# Patient Record
Sex: Female | Born: 1990 | Race: White | Hispanic: No | Marital: Single | Smoking: Current every day smoker
Health system: Southern US, Community
[De-identification: ages and names within clinical notes are randomized; demographics above are authoritative.]

## PROBLEM LIST (undated history)

## (undated) DIAGNOSIS — B009 Herpesviral infection, unspecified: Secondary | ICD-10-CM

## (undated) DIAGNOSIS — O4400 Placenta previa specified as without hemorrhage, unspecified trimester: Secondary | ICD-10-CM

## (undated) DIAGNOSIS — Z3490 Encounter for supervision of normal pregnancy, unspecified, unspecified trimester: Secondary | ICD-10-CM

## (undated) DIAGNOSIS — B029 Zoster without complications: Secondary | ICD-10-CM

## (undated) DIAGNOSIS — B379 Candidiasis, unspecified: Secondary | ICD-10-CM

## (undated) DIAGNOSIS — N200 Calculus of kidney: Secondary | ICD-10-CM

## (undated) HISTORY — DX: Complete placenta previa nos or without hemorrhage, unspecified trimester: O44.00

## (undated) HISTORY — PX: NO PAST SURGERIES: SHX2092

## (undated) HISTORY — DX: Zoster without complications: B02.9

---
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
## 2009-08-31 ENCOUNTER — Emergency Department (HOSPITAL_COMMUNITY): Admission: EM | Admit: 2009-08-31 | Discharge: 2009-08-31 | Payer: Self-pay | Admitting: Emergency Medicine

## 2009-09-01 ENCOUNTER — Emergency Department (HOSPITAL_COMMUNITY): Admission: EM | Admit: 2009-09-01 | Discharge: 2009-09-01 | Payer: Self-pay | Admitting: Emergency Medicine

## 2009-11-14 ENCOUNTER — Emergency Department (HOSPITAL_COMMUNITY): Admission: EM | Admit: 2009-11-14 | Discharge: 2009-11-14 | Payer: Self-pay | Admitting: Emergency Medicine

## 2011-03-18 LAB — URINE MICROSCOPIC-ADD ON

## 2011-03-18 LAB — URINALYSIS, ROUTINE W REFLEX MICROSCOPIC
Bilirubin Urine: NEGATIVE
Bilirubin Urine: NEGATIVE
Glucose, UA: NEGATIVE mg/dL
Glucose, UA: NEGATIVE mg/dL
Ketones, ur: NEGATIVE mg/dL
Ketones, ur: NEGATIVE mg/dL
Leukocytes, UA: NEGATIVE
Leukocytes, UA: NEGATIVE
Nitrite: NEGATIVE
Nitrite: NEGATIVE
Protein, ur: NEGATIVE mg/dL
Protein, ur: NEGATIVE mg/dL
Specific Gravity, Urine: 1.015 (ref 1.005–1.030)
Specific Gravity, Urine: 1.03 (ref 1.005–1.030)
Urobilinogen, UA: 0.2 mg/dL (ref 0.0–1.0)
Urobilinogen, UA: 0.2 mg/dL (ref 0.0–1.0)
pH: 5.5 (ref 5.0–8.0)
pH: 6 (ref 5.0–8.0)

## 2011-03-18 LAB — PREGNANCY, URINE
Preg Test, Ur: NEGATIVE
Preg Test, Ur: NEGATIVE

## 2011-03-18 LAB — URINE CULTURE: Colony Count: 5000

## 2011-05-09 ENCOUNTER — Emergency Department (HOSPITAL_COMMUNITY)
Admission: EM | Admit: 2011-05-09 | Discharge: 2011-05-09 | Disposition: A | Discharge status: Home / Self Care | Payer: 59 | Attending: Emergency Medicine | Admitting: Emergency Medicine

## 2011-05-09 DIAGNOSIS — L559 Sunburn, unspecified: Secondary | ICD-10-CM | POA: Insufficient documentation

## 2011-05-09 DIAGNOSIS — W899XXA Exposure to unspecified man-made visible and ultraviolet light, initial encounter: Secondary | ICD-10-CM | POA: Insufficient documentation

## 2011-05-09 DIAGNOSIS — Z87442 Personal history of urinary calculi: Secondary | ICD-10-CM | POA: Insufficient documentation

## 2011-05-09 DIAGNOSIS — L259 Unspecified contact dermatitis, unspecified cause: Secondary | ICD-10-CM | POA: Insufficient documentation

## 2011-09-07 ENCOUNTER — Encounter: Payer: Self-pay | Admitting: Emergency Medicine

## 2011-09-07 ENCOUNTER — Emergency Department (HOSPITAL_COMMUNITY): Payer: 59

## 2011-09-07 ENCOUNTER — Emergency Department (HOSPITAL_COMMUNITY)
Admission: EM | Admit: 2011-09-07 | Discharge: 2011-09-07 | Disposition: A | Discharge status: Home / Self Care | Payer: 59 | Attending: Emergency Medicine | Admitting: Emergency Medicine

## 2011-09-07 DIAGNOSIS — IMO0002 Reserved for concepts with insufficient information to code with codable children: Secondary | ICD-10-CM | POA: Insufficient documentation

## 2011-09-07 DIAGNOSIS — F17200 Nicotine dependence, unspecified, uncomplicated: Secondary | ICD-10-CM | POA: Insufficient documentation

## 2011-09-07 DIAGNOSIS — S6390XA Sprain of unspecified part of unspecified wrist and hand, initial encounter: Secondary | ICD-10-CM | POA: Insufficient documentation

## 2011-09-07 MED ORDER — IBUPROFEN 800 MG PO TABS
800.0000 mg | ORAL_TABLET | Freq: Once | ORAL | Status: AC
  Administered 2011-09-07: 800 mg via ORAL
  Filled 2011-09-07: qty 1

## 2011-09-07 MED ORDER — IBUPROFEN 800 MG PO TABS: 800.0000 mg | ORAL_TABLET | Freq: Three times a day (TID) | ORAL | Status: AC

## 2011-09-07 NOTE — ED Notes (Signed)
Pt c/o right lateral hand pain after hitting it against metal bar earlier today.

## 2011-09-07 NOTE — ED Provider Notes (Signed)
History     CSN: [id number] Arrival date & time: 09/07/2011  6:04 PM  Chief Complaint  Patient presents with  . Hand Pain    (Consider location/radiation/quality/duration/timing/severity/associated sxs/prior treatment) Patient is a 20 y.o. female presenting with hand pain. The history is provided by the patient.  Hand Pain This is a new problem. The current episode started today. The problem occurs constantly. Associated symptoms include arthralgias. Pertinent negatives include no abdominal pain, chest pain, congestion, fever, headaches, joint swelling, nausea, neck pain, numbness, rash, sore throat or weakness. Associated symptoms comments: She was pushing against a metal object on a piece of farm equipment while her boyfriend was putting bolts in it when her hands slipped,  Causing pain and hyperextension of her hands.  Her right hand hurts worse than the left.. Exacerbated by: flexing fingers and palpation makes worse. She has tried nothing for the symptoms.    History reviewed. No pertinent past medical history.  History reviewed. No pertinent past surgical history.  History reviewed. No pertinent family history.  History  Substance Use Topics  . Smoking status: Current Everyday Smoker -- 0.5 packs/day  . Smokeless tobacco: Not on file  . Alcohol Use: No    OB History    Grav Para Term Preterm Abortions TAB SAB Ect Mult Living                  Review of Systems  Constitutional: Negative for fever.  HENT: Negative for congestion, sore throat and neck pain.   Eyes: Negative.   Respiratory: Negative for chest tightness and shortness of breath.   Cardiovascular: Negative for chest pain.  Gastrointestinal: Negative for nausea and abdominal pain.  Genitourinary: Negative.   Musculoskeletal: Positive for arthralgias. Negative for joint swelling.  Skin: Positive for color change. Negative for rash and wound.  Neurological: Negative for dizziness, weakness, light-headedness,  numbness and headaches.  Hematological: Negative.   Psychiatric/Behavioral: Negative.     Allergies  Review of patient's allergies indicates no known allergies.  Home Medications   Current Outpatient Rx  Name Route Sig Dispense Refill  . IBUPROFEN 200 MG PO TABS Oral Take 400 mg by mouth as needed. For pain     . TETRAHYDROZOLINE HCL 0.05 % OP SOLN Both Eyes Place 1 drop into both eyes daily as needed. For red eye and relief       BP 115/63  Pulse 91  Temp(Src) 99.2 F (37.3 C) (Oral)  Resp 18  Ht 5\' 6"  (1.676 m)  Wt 118 lb 11.2 oz (53.842 kg)  BMI 19.16 kg/m2  SpO2 100%  LMP 08/19/2011  Physical Exam  Nursing note and vitals reviewed. Constitutional: She is oriented to person, place, and time. She appears well-developed and well-nourished.  HENT:  Head: Normocephalic and atraumatic.  Eyes: Conjunctivae are normal.  Neck: Normal range of motion.  Cardiovascular: Normal rate, regular rhythm, normal heart sounds and intact distal pulses.  Exam reveals no decreased pulses.   Pulses:      Dorsalis pedis pulses are 2+ on the right side, and 2+ on the left side.       Posterior tibial pulses are 2+ on the right side, and 2+ on the left side.  Pulmonary/Chest: Effort normal and breath sounds normal. She has no wheezes.  Abdominal: Soft. Bowel sounds are normal. There is no tenderness.  Musculoskeletal: Normal range of motion. She exhibits edema and tenderness.       Right hand: She exhibits tenderness and swelling. She  exhibits normal range of motion, normal two-point discrimination, normal capillary refill and no deformity. normal sensation noted. Normal strength noted.       Left hand: She exhibits tenderness. She exhibits normal range of motion, normal capillary refill and no deformity. normal sensation noted. Normal strength noted.       Hands: Neurological: She is alert and oriented to person, place, and time. No sensory deficit.  Skin: Skin is warm, dry and intact.    Psychiatric: She has a normal mood and affect.    ED Course  Procedures (including critical care time)  Labs Reviewed - No data to display Dg Hand Complete Left  09/07/2011  *RADIOLOGY REPORT*  Clinical Data: Trauma.  Pain fifth metacarpals.  LEFT HAND - COMPLETE 3+ VIEW  Comparison: Right hand radiographs same day.  Findings: Some of the digits overlap in the lateral projection, limiting detail of the individual fingers.  The joints appear aligned.  No subluxation or dislocation is identified.  No acute or healing fracture or focal bony abnormality.  No discrete soft tissue swelling or radiopaque foreign body.  IMPRESSION: Negative.  Original Report Authenticated By: Britta Mccreedy, M.D.   Dg Hand Complete Right  09/07/2011  *RADIOLOGY REPORT*  Clinical Data: Bilateral hand pain fifth metacarpals.  RIGHT HAND - COMPLETE 3+ VIEW  Comparison: Left hand radiographs 09/07/2011 and right wrist radiographs 08/12/2008  Findings:  Some of the fingers overlap on the lateral projection, limiting detail of the individual digits.  No evidence of subluxation or dislocation.  No acute or healing fracture is identified. No focal bony abnormality.  No discrete soft tissue swelling or radiopaque foreign body.  IMPRESSION: Negative.  Original Report Authenticated By: Britta Mccreedy, M.D.     No diagnosis found.    MDM  Hand sprain and contusion.          Candis Musa, PA 09/07/11 1951

## 2011-09-09 NOTE — ED Provider Notes (Signed)
Medical screening examination/treatment/procedure(s) were performed by non-physician practitioner and as supervising physician I was immediately available for consultation/collaboration.   Aldine Grainger, MD 09/09/11 0514 

## 2011-09-16 LAB — URINE MICROSCOPIC-ADD ON

## 2011-09-16 LAB — CBC
HCT: 40.6 % (ref 36.0–49.0)
Hemoglobin: 13.8 g/dL (ref 12.0–16.0)
MCHC: 34 g/dL (ref 31.0–37.0)
MCV: 91.2 fL (ref 78.0–98.0)
Platelets: 221 10*3/uL (ref 150–400)
RBC: 4.46 MIL/uL (ref 3.80–5.70)
RDW: 13.3 % (ref 11.4–15.5)
WBC: 6.8 10*3/uL (ref 4.5–13.5)

## 2011-09-16 LAB — URINALYSIS, ROUTINE W REFLEX MICROSCOPIC
Bilirubin Urine: NEGATIVE
Glucose, UA: NEGATIVE mg/dL
Ketones, ur: NEGATIVE mg/dL
Leukocytes, UA: NEGATIVE
Nitrite: NEGATIVE
Protein, ur: NEGATIVE mg/dL
Specific Gravity, Urine: 1.025 (ref 1.005–1.030)
Urobilinogen, UA: 0.2 mg/dL (ref 0.0–1.0)
pH: 6.5 (ref 5.0–8.0)

## 2011-09-16 LAB — DIFFERENTIAL
Basophils Absolute: 0 10*3/uL (ref 0.0–0.1)
Basophils Relative: 1 % (ref 0–1)
Eosinophils Absolute: 0.1 10*3/uL (ref 0.0–1.2)
Eosinophils Relative: 1 % (ref 0–5)
Lymphocytes Relative: 40 % (ref 24–48)
Lymphs Abs: 2.7 10*3/uL (ref 1.1–4.8)
Monocytes Absolute: 0.7 10*3/uL (ref 0.2–1.2)
Monocytes Relative: 11 % (ref 3–11)
Neutro Abs: 3.2 10*3/uL (ref 1.7–8.0)
Neutrophils Relative %: 47 % (ref 43–71)

## 2011-09-16 LAB — WET PREP, GENITAL
Trich, Wet Prep: NONE SEEN
Yeast Wet Prep HPF POC: NONE SEEN

## 2011-09-16 LAB — COMPREHENSIVE METABOLIC PANEL
Alkaline Phosphatase: 66 U/L (ref 47–119)
BUN: 12 mg/dL (ref 6–23)
CO2: 24 mEq/L (ref 19–32)
Glucose, Bld: 113 mg/dL — ABNORMAL HIGH (ref 70–99)
Potassium: 3.5 mEq/L (ref 3.5–5.1)
Total Bilirubin: 1 mg/dL (ref 0.3–1.2)
Total Protein: 7.1 g/dL (ref 6.0–8.3)

## 2011-09-16 LAB — PREGNANCY, URINE: Preg Test, Ur: NEGATIVE

## 2012-07-20 ENCOUNTER — Emergency Department (HOSPITAL_COMMUNITY)
Admission: EM | Admit: 2012-07-20 | Discharge: 2012-07-20 | Disposition: A | Discharge status: Home / Self Care | Payer: 59 | Attending: Emergency Medicine | Admitting: Emergency Medicine

## 2012-07-20 ENCOUNTER — Encounter (HOSPITAL_COMMUNITY): Payer: Self-pay | Admitting: *Deleted

## 2012-07-20 ENCOUNTER — Emergency Department (HOSPITAL_COMMUNITY): Payer: 59

## 2012-07-20 DIAGNOSIS — F17200 Nicotine dependence, unspecified, uncomplicated: Secondary | ICD-10-CM | POA: Insufficient documentation

## 2012-07-20 DIAGNOSIS — R109 Unspecified abdominal pain: Secondary | ICD-10-CM | POA: Insufficient documentation

## 2012-07-20 HISTORY — DX: Calculus of kidney: N20.0

## 2012-07-20 LAB — URINALYSIS, ROUTINE W REFLEX MICROSCOPIC
Bilirubin Urine: NEGATIVE
Glucose, UA: NEGATIVE mg/dL
Hgb urine dipstick: NEGATIVE
Ketones, ur: NEGATIVE mg/dL
Protein, ur: NEGATIVE mg/dL
pH: 7.5 (ref 5.0–8.0)

## 2012-07-20 MED ORDER — KETOROLAC TROMETHAMINE 60 MG/2ML IM SOLN
30.0000 mg | Freq: Once | INTRAMUSCULAR | Status: AC
  Administered 2012-07-20: 30 mg via INTRAMUSCULAR
  Filled 2012-07-20: qty 2

## 2012-07-20 MED ORDER — ONDANSETRON HCL 4 MG/2ML IJ SOLN
4.0000 mg | Freq: Once | INTRAMUSCULAR | Status: AC
  Administered 2012-07-20: 4 mg via INTRAVENOUS
  Filled 2012-07-20: qty 2

## 2012-07-20 MED ORDER — PROMETHAZINE HCL 25 MG RE SUPP: 25.0000 mg | Freq: Four times a day (QID) | RECTAL | Status: DC | PRN

## 2012-07-20 MED ORDER — MORPHINE SULFATE 4 MG/ML IJ SOLN
4.0000 mg | Freq: Once | INTRAMUSCULAR | Status: AC
  Administered 2012-07-20: 4 mg via INTRAVENOUS
  Filled 2012-07-20: qty 1

## 2012-07-20 MED ORDER — TAMSULOSIN HCL 0.4 MG PO CAPS: ORAL_CAPSULE | ORAL | Status: DC

## 2012-07-20 MED ORDER — HYDROCODONE-ACETAMINOPHEN 5-325 MG PO TABS: ORAL_TABLET | ORAL | Status: DC

## 2012-07-20 MED ORDER — SODIUM CHLORIDE 0.9 % IV SOLN
INTRAVENOUS | Status: DC
  Administered 2012-07-20: 16:00:00 via INTRAVENOUS

## 2012-07-20 NOTE — ED Notes (Signed)
11 am onset of lt flank pain, nausea , no vomiting, no dysuria.

## 2012-07-20 NOTE — ED Notes (Signed)
Discharge instructions reviewed with pt, questions answered. Pt verbalized understanding.  

## 2012-07-20 NOTE — ED Notes (Signed)
Patient is wondering how long before discharge.

## 2012-07-20 NOTE — ED Notes (Signed)
Pt c/o left flank pain since this am. Pt alert and oriented x 3. Skin warm and dry. Color pink. Pt states that she has a history of kidney stones. Family at bedside.

## 2012-07-20 NOTE — ED Provider Notes (Signed)
History   This chart was scribed for Ward Givens, MD by Melba Coon. The patient was seen in room APA08/APA08 and the patient's care was started at 4:09PM.    CSN: [id number]  Arrival date & time 07/20/12  1442   First MD Initiated Contact with Patient 07/20/12 1559      Chief Complaint  Patient presents with  . Flank Pain    (Consider location/radiation/quality/duration/timing/severity/associated sxs/prior treatment) The history is provided by the patient. No language interpreter was used.   Christine Day is a 21 y.o. female who presents to the Emergency Department complaining of constant, moderate to severe, achey left flank pain with an onset this morning at 11AM. Pt is nauseous and feels she is having difficulty urinating, but no hematuria. LNMP: in the last week. Laying down flat on her back aggravates the pain; bending forward at the wasit or pressing on her flank alleviates the pain. Pt has a Hx of kidney stones; last kidney stone was 2 years ago; pt had similar symptoms with last kidney stone. Pt passes kidney stones, on average, less than a day. Pt drinks 3 cans of Mountain Dew a day and sweet tea everyday. Family Hx of kidney stones - grandmother and 3 aunts on father's side. No HA, fever, neck pain, sore throat, rash, back pain, CP, SOB, abd pain, vomiting, diarrhea, dysuria, or extremity pain, edema, weakness, numbness, or tingling. Pt is sexually active and uses condoms. No known allergies. No other pertinent medical symptoms.  PCP: Dr. Lilyan Punt   Past Medical History  Diagnosis Date  . Kidney stone     History reviewed. No pertinent past surgical history.  History reviewed. No pertinent family history. MGM, 3 Maunts all have renal stones  History  Substance Use Topics  . Smoking status: Current Everyday Smoker -- 0.5 packs/day  . Smokeless tobacco: Not on file  . Alcohol Use: No  drinks a lot of caffeine, minimal milk products   OB History    Grav  Para Term Preterm Abortions TAB SAB Ect Mult Living                  Review of Systems 10 Systems reviewed and all are negative for acute change except as noted in the HPI.   Allergies  Review of patient's allergies indicates no known allergies.  Home Medications   Current Outpatient Rx  Name Route Sig Dispense Refill  . IBUPROFEN 200 MG PO TABS Oral Take 400 mg by mouth as needed. For pain     . TETRAHYDROZOLINE HCL 0.05 % OP SOLN Both Eyes Place 1 drop into both eyes daily as needed. For red eye and relief       BP 117/71  Pulse 88  Temp 98.4 F (36.9 C) (Oral)  Resp 20  Ht 5\' 6"  (1.676 m)  Wt 118 lb (53.524 kg)  BMI 19.05 kg/m2  SpO2 100%  LMP 07/10/2012  Vital signs normal    Physical Exam  Nursing note and vitals reviewed. Constitutional: She is oriented to person, place, and time. She appears well-developed and well-nourished.  Non-toxic appearance. She does not appear ill. No distress.  HENT:  Head: Normocephalic and atraumatic.  Right Ear: External ear normal.  Left Ear: External ear normal.  Nose: Nose normal. No mucosal edema or rhinorrhea.  Mouth/Throat: Oropharynx is clear and moist and mucous membranes are normal. No dental abscesses or uvula swelling.  Eyes: Conjunctivae and EOM are normal. Pupils are equal, round,  and reactive to light.  Neck: Normal range of motion and full passive range of motion without pain. Neck supple. No tracheal deviation present.  Cardiovascular: Normal rate, regular rhythm and normal heart sounds.  Exam reveals no gallop and no friction rub.   No murmur heard. Pulmonary/Chest: Effort normal and breath sounds normal. No respiratory distress. She has no wheezes. She has no rhonchi. She has no rales. She exhibits no tenderness and no crepitus.  Abdominal: Soft. Normal appearance and bowel sounds are normal. She exhibits no distension. There is tenderness (mild LLQ tenderness). There is no rebound and no guarding.  Genitourinary:         No CVAT bilaterally  Musculoskeletal: Normal range of motion. She exhibits no edema and no tenderness.       Moves all extremities well.   Neurological: She is alert and oriented to person, place, and time. She has normal strength. No cranial nerve deficit.  Skin: Skin is warm, dry and intact. No rash noted. No erythema. No pallor.  Psychiatric: She has a normal mood and affect. Her speech is normal and behavior is normal. Her mood appears not anxious.    ED Course  Procedures (including critical care time)  Medications  0.9 %  sodium chloride infusion (  Intravenous New Bag/Given 07/20/12 1625)  ketorolac (TORADOL) injection 30 mg (not administered)  morphine 4 MG/ML injection 4 mg (not administered)  morphine 4 MG/ML injection 4 mg (4 mg Intravenous Given 07/20/12 1627)  ondansetron (ZOFRAN) injection 4 mg (4 mg Intravenous Given 07/20/12 1627)   Review of old records shows patient's last CT of the abdomen pelvis was in 2009 showing a nonobstructing 2 mm left lower renal pole calculus. At that time she had a 1-2 mm right UVJ stone.  DIAGNOSTIC STUDIES: Oxygen Saturation is 100% on room air, normal by my interpretation.    COORDINATION OF CARE:  4:16PM - IV fluids, morphine, Zofran, renal ultrasound, and UA will be ordered for the pt. 7:17PM - recheck; lab and imaging results reviewed; results are negative, no blockage noted. Pt states that she feels much better and is ready for d/c. Marland Kitchen Results for orders placed during the hospital encounter of 07/20/12  URINALYSIS, ROUTINE W REFLEX MICROSCOPIC      Component Value Range   Color, Urine YELLOW  YELLOW   APPearance HAZY (*) CLEAR   Specific Gravity, Urine 1.020  1.005 - 1.030   pH 7.5  5.0 - 8.0   Glucose, UA NEGATIVE  NEGATIVE mg/dL   Hgb urine dipstick NEGATIVE  NEGATIVE   Bilirubin Urine NEGATIVE  NEGATIVE   Ketones, ur NEGATIVE  NEGATIVE mg/dL   Protein, ur NEGATIVE  NEGATIVE mg/dL   Urobilinogen, UA 1.0  0.0 - 1.0 mg/dL    Nitrite NEGATIVE  NEGATIVE   Leukocytes, UA NEGATIVE  NEGATIVE  PREGNANCY, URINE      Component Value Range   Preg Test, Ur NEGATIVE  NEGATIVE   Laboratory interpretation all normal  US Renal  07/20/2012  *RADIOLOGY REPORT*  Clinical Data: Left flank pain, history of stone  RENAL/URINARY TRACT ULTRASOUND COMPLETE  Comparison:  CT abdomen pelvis dated 11/22/2008  Findings:  Right Kidney:  Measures 11.9 cm.  No mass or hydronephrosis.  Left Kidney:  Measures 11.1 cm.  No mass or hydronephrosis.  Bladder:  Within normal limits.  Bilateral bladder jets are present.  IMPRESSION: Negative renal ultrasound.  Original Report Authenticated By: Charline Bills, M.D.     1. Left flank  pain    New Prescriptions   HYDROCODONE-ACETAMINOPHEN (NORCO/VICODIN) 5-325 MG PER TABLET    Take 1 or 2 po Q 6hrs for pain   PROMETHAZINE (PHENERGAN) 25 MG SUPPOSITORY    Place 1 suppository (25 mg total) rectally every 6 (six) hours as needed for nausea.   TAMSULOSIN HCL (FLOMAX) 0.4 MG CAPS    Take 1 po QD until you pass the stone.    Plan discharge  Devoria Albe, MD, FACEP    MDM   I personally performed the services described in this documentation, which was scribed in my presence. The recorded information has been reviewed and considered.        Ward Givens, MD 07/20/12 2026

## 2013-05-23 ENCOUNTER — Other Ambulatory Visit (INDEPENDENT_AMBULATORY_CARE_PROVIDER_SITE_OTHER): Payer: BC Managed Care – PPO | Admitting: Obstetrics & Gynecology

## 2013-05-23 VITALS — BP 120/80 | Ht 65.0 in | Wt 111.0 lb

## 2013-05-23 DIAGNOSIS — Z3201 Encounter for pregnancy test, result positive: Secondary | ICD-10-CM

## 2013-05-23 DIAGNOSIS — Z3200 Encounter for pregnancy test, result unknown: Secondary | ICD-10-CM

## 2013-05-30 ENCOUNTER — Other Ambulatory Visit: Payer: Self-pay | Admitting: Obstetrics & Gynecology

## 2013-05-30 DIAGNOSIS — O3680X0 Pregnancy with inconclusive fetal viability, not applicable or unspecified: Secondary | ICD-10-CM

## 2013-06-06 ENCOUNTER — Ambulatory Visit (INDEPENDENT_AMBULATORY_CARE_PROVIDER_SITE_OTHER): Payer: BC Managed Care – PPO

## 2013-06-06 ENCOUNTER — Other Ambulatory Visit: Payer: Self-pay | Admitting: Obstetrics & Gynecology

## 2013-06-06 DIAGNOSIS — O26849 Uterine size-date discrepancy, unspecified trimester: Secondary | ICD-10-CM

## 2013-06-06 DIAGNOSIS — O3680X0 Pregnancy with inconclusive fetal viability, not applicable or unspecified: Secondary | ICD-10-CM

## 2013-06-06 NOTE — Progress Notes (Signed)
U/S-single IUP with +FCA noted, CRL c/w 9+2wks, EDD 01/07/2014, cx long and closed, retroflexed uterus, bilateral adnexa wnl

## 2013-06-12 ENCOUNTER — Other Ambulatory Visit: Payer: Self-pay | Admitting: Obstetrics & Gynecology

## 2013-06-12 DIAGNOSIS — Z36 Encounter for antenatal screening of mother: Secondary | ICD-10-CM

## 2013-06-20 ENCOUNTER — Other Ambulatory Visit: Payer: BC Managed Care – PPO

## 2013-06-20 ENCOUNTER — Encounter: Payer: BC Managed Care – PPO | Admitting: Adult Health

## 2013-06-21 ENCOUNTER — Other Ambulatory Visit: Payer: BC Managed Care – PPO

## 2013-06-21 ENCOUNTER — Other Ambulatory Visit: Payer: Self-pay | Admitting: Obstetrics & Gynecology

## 2013-06-21 ENCOUNTER — Ambulatory Visit (INDEPENDENT_AMBULATORY_CARE_PROVIDER_SITE_OTHER): Payer: BC Managed Care – PPO

## 2013-06-21 DIAGNOSIS — Z3401 Encounter for supervision of normal first pregnancy, first trimester: Secondary | ICD-10-CM

## 2013-06-21 DIAGNOSIS — Z36 Encounter for antenatal screening of mother: Secondary | ICD-10-CM

## 2013-06-21 LAB — CBC
MCH: 30.6 pg (ref 26.0–34.0)
MCHC: 34.4 g/dL (ref 30.0–36.0)
MCV: 88.9 fL (ref 78.0–100.0)
Platelets: 205 10*3/uL (ref 150–400)

## 2013-06-21 LAB — HEPATITIS B SURFACE ANTIGEN: Hepatitis B Surface Ag: NEGATIVE

## 2013-06-21 LAB — TSH: TSH: 0.268 u[IU]/mL — ABNORMAL LOW (ref 0.350–4.500)

## 2013-06-21 LAB — HIV ANTIBODY (ROUTINE TESTING W REFLEX): HIV: NONREACTIVE

## 2013-06-21 NOTE — Progress Notes (Signed)
U/S(11+3wks)-single IUP with +FCA noted, CRL c/w dates, cx long and closed, bilateral adnexa WNL, NB present, Nt=1.34mm

## 2013-06-22 LAB — ANTIBODY SCREEN: Antibody Screen: NEGATIVE

## 2013-06-22 LAB — RUBELLA SCREEN: Rubella: 3.66 Index — ABNORMAL HIGH (ref <0.90)

## 2013-06-22 LAB — VARICELLA ZOSTER ANTIBODY, IGG: Varicella IgG: 729.1 Index — ABNORMAL HIGH (ref <135.00)

## 2013-06-22 LAB — ABO AND RH: Rh Type: POSITIVE

## 2013-06-24 ENCOUNTER — Encounter (HOSPITAL_COMMUNITY): Payer: Self-pay | Admitting: *Deleted

## 2013-06-24 ENCOUNTER — Telehealth: Payer: Self-pay | Admitting: Obstetrics & Gynecology

## 2013-06-24 ENCOUNTER — Emergency Department (HOSPITAL_COMMUNITY)
Admission: EM | Admit: 2013-06-24 | Discharge: 2013-06-25 | Disposition: A | Discharge status: Home / Self Care | Payer: BC Managed Care – PPO | Attending: Emergency Medicine | Admitting: Emergency Medicine

## 2013-06-24 DIAGNOSIS — O9989 Other specified diseases and conditions complicating pregnancy, childbirth and the puerperium: Secondary | ICD-10-CM | POA: Insufficient documentation

## 2013-06-24 DIAGNOSIS — R079 Chest pain, unspecified: Secondary | ICD-10-CM | POA: Insufficient documentation

## 2013-06-24 DIAGNOSIS — Z87891 Personal history of nicotine dependence: Secondary | ICD-10-CM | POA: Insufficient documentation

## 2013-06-24 DIAGNOSIS — F17200 Nicotine dependence, unspecified, uncomplicated: Secondary | ICD-10-CM | POA: Insufficient documentation

## 2013-06-24 DIAGNOSIS — Z87442 Personal history of urinary calculi: Secondary | ICD-10-CM | POA: Insufficient documentation

## 2013-06-24 DIAGNOSIS — R109 Unspecified abdominal pain: Secondary | ICD-10-CM

## 2013-06-24 HISTORY — DX: Encounter for supervision of normal pregnancy, unspecified, unspecified trimester: Z34.90

## 2013-06-24 LAB — URINALYSIS, ROUTINE W REFLEX MICROSCOPIC
Bilirubin Urine: NEGATIVE
Glucose, UA: 100 mg/dL — AB
Hgb urine dipstick: NEGATIVE
Ketones, ur: NEGATIVE mg/dL
Protein, ur: NEGATIVE mg/dL
pH: 6 (ref 5.0–8.0)

## 2013-06-24 LAB — CBC WITH DIFFERENTIAL/PLATELET
Basophils Absolute: 0 10*3/uL (ref 0.0–0.1)
Basophils Relative: 0 % (ref 0–1)
Eosinophils Relative: 0 % (ref 0–5)
HCT: 33.6 % — ABNORMAL LOW (ref 36.0–46.0)
Hemoglobin: 11.9 g/dL — ABNORMAL LOW (ref 12.0–15.0)
Lymphocytes Relative: 22 % (ref 12–46)
MCHC: 35.4 g/dL (ref 30.0–36.0)
MCV: 92.1 fL (ref 78.0–100.0)
Monocytes Absolute: 0.7 10*3/uL (ref 0.1–1.0)
Monocytes Relative: 7 % (ref 3–12)
Neutro Abs: 7.9 10*3/uL — ABNORMAL HIGH (ref 1.7–7.7)
RDW: 12.5 % (ref 11.5–15.5)

## 2013-06-24 LAB — BASIC METABOLIC PANEL
BUN: 11 mg/dL (ref 6–23)
CO2: 26 mEq/L (ref 19–32)
Calcium: 9.2 mg/dL (ref 8.4–10.5)
Chloride: 102 mEq/L (ref 96–112)
Creatinine, Ser: 0.51 mg/dL (ref 0.50–1.10)

## 2013-06-24 MED ORDER — HYDROMORPHONE HCL PF 1 MG/ML IJ SOLN
1.0000 mg | Freq: Once | INTRAMUSCULAR | Status: AC
  Administered 2013-06-24: 1 mg via INTRAVENOUS
  Filled 2013-06-24: qty 1

## 2013-06-24 MED ORDER — ONDANSETRON HCL 4 MG/2ML IJ SOLN
4.0000 mg | Freq: Once | INTRAMUSCULAR | Status: AC
  Administered 2013-06-24: 4 mg via INTRAVENOUS
  Filled 2013-06-24: qty 2

## 2013-06-24 MED ORDER — MORPHINE SULFATE 4 MG/ML IJ SOLN
6.0000 mg | Freq: Once | INTRAMUSCULAR | Status: AC
  Administered 2013-06-24: 4 mg via INTRAVENOUS
  Filled 2013-06-24: qty 1

## 2013-06-24 MED ORDER — SODIUM CHLORIDE 0.9 % IV SOLN
1000.0000 mL | Freq: Once | INTRAVENOUS | Status: AC
  Administered 2013-06-24: 1000 mL via INTRAVENOUS

## 2013-06-24 MED ORDER — MORPHINE SULFATE 4 MG/ML IJ SOLN
6.0000 mg | Freq: Once | INTRAMUSCULAR | Status: AC
  Administered 2013-06-24: 6 mg via INTRAVENOUS
  Filled 2013-06-24: qty 2

## 2013-06-24 MED ORDER — MORPHINE SULFATE 2 MG/ML IJ SOLN
INTRAMUSCULAR | Status: AC
  Administered 2013-06-24: 2 mg
  Filled 2013-06-24: qty 1

## 2013-06-24 NOTE — ED Notes (Signed)
Pt co sharp flank pain and states unable to urinate this morning, pt states she is [redacted] weeks pregnant. Pt has Hx of kidney stones and pain has increased to 8/10.

## 2013-06-24 NOTE — ED Provider Notes (Signed)
History    This chart was scribed for Benny Lennert, MD, by Frederik Pear, ED scribe. The patient was seen in room APA02/APA02 and the patient's care was started at 2059.   CSN: [id number] Arrival date & time 06/24/13  4098  First MD Initiated Contact with Patient 06/24/13 2059     Chief Complaint  Patient presents with  . Abdominal Pain   (Consider location/radiation/quality/duration/timing/severity/associated sxs/prior Treatment) Patient is a 22 y.o. female presenting with flank pain. The history is provided by the patient and medical records. No language interpreter was used.  Flank Pain This is a recurrent problem. The current episode started 12 to 24 hours ago. The problem occurs constantly. The problem has been gradually worsening. Associated symptoms include chest pain and abdominal pain. Pertinent negatives include no shortness of breath.    HPI Comments: Christine Day is a 22 y.o. female with a h/o of kidney stones x5 in the last 3 years who presents to the Emergency Department complaining of worsening, sharp left flank pain that radiated to her abdomen, back, and chest that began this morning and worsened until 1500. She reports the all of the pain aside from the flank pain has resolved, but the pain is still constant and severe. She reports a h/o of similar pain consistent with previous kidney stones. She also reports she has been unable to void since this morning. Denies nausea. She states she is currently [redacted] weeks pregnant and has ad 2 US of the fetus with the most recent being on 07/10.   Past Medical History  Diagnosis Date  . Kidney stone   . Pregnant    History reviewed. No pertinent past surgical history. History reviewed. No pertinent family history. History  Substance Use Topics  . Smoking status: Current Every Day Smoker -- 0.50 packs/day    Types: Cigarettes  . Smokeless tobacco: Not on file  . Alcohol Use: No   OB History   Grav Para Term Preterm  Abortions TAB SAB Ect Mult Living   1              Review of Systems  Constitutional: Negative for appetite change and fatigue.  HENT: Negative for congestion, sinus pressure and ear discharge.   Eyes: Negative for discharge.  Respiratory: Negative for cough and shortness of breath.   Cardiovascular: Positive for chest pain.  Gastrointestinal: Positive for abdominal pain. Negative for nausea, vomiting and diarrhea.  Genitourinary: Positive for flank pain. Negative for frequency and hematuria.  Musculoskeletal: Negative for back pain.  Skin: Negative for rash.  Neurological: Negative for seizures.  Psychiatric/Behavioral: Negative for hallucinations.    Allergies  Hydrocodone  Home Medications   Current Outpatient Rx  Name  Route  Sig  Dispense  Refill  . acetaminophen (TYLENOL) 325 MG tablet   Oral   Take 650 mg by mouth every 6 (six) hours as needed for pain.          BP 130/68  Pulse 82  Temp(Src) 98.3 F (36.8 C) (Oral)  Resp 20  Ht 5\' 6"  (1.676 m)  Wt 114 lb (51.71 kg)  BMI 18.41 kg/m2  SpO2 100%  LMP 04/07/2013 Physical Exam  Nursing note and vitals reviewed. Constitutional: She is oriented to person, place, and time. She appears well-developed.  HENT:  Head: Normocephalic.  Eyes: Conjunctivae and EOM are normal. No scleral icterus.  Neck: Neck supple. No thyromegaly present.  Cardiovascular: Normal rate and regular rhythm.  Exam reveals no gallop  and no friction rub.   No murmur heard. Pulmonary/Chest: No stridor. She has no wheezes. She has no rales. She exhibits no tenderness.  Abdominal: She exhibits no distension. There is no rebound.  Musculoskeletal: Normal range of motion. She exhibits tenderness. She exhibits no edema.  Moderate left flank tenderness  Lymphadenopathy:    She has no cervical adenopathy.  Neurological: She is oriented to person, place, and time. Coordination normal.  Skin: No rash noted. No erythema.  Psychiatric: She has a  normal mood and affect. Her behavior is normal.    ED Course  Procedures (including critical care time)  DIAGNOSTIC STUDIES: Oxygen Saturation is 100% on room air, normal by my interpretation.    COORDINATION OF CARE:  21:05- Discussed planned course of treatment with the patient, including morphine, zofran, UA, in and out cath, and blood work, who is agreeable at this time.  Results for orders placed during the hospital encounter of 06/24/13  CBC WITH DIFFERENTIAL      Result Value Range   WBC 11.1 (*) 4.0 - 10.5 K/uL   RBC 3.65 (*) 3.87 - 5.11 MIL/uL   Hemoglobin 11.9 (*) 12.0 - 15.0 g/dL   HCT 04.5 (*) 40.9 - 81.1 %   MCV 92.1  78.0 - 100.0 fL   MCH 32.6  26.0 - 34.0 pg   MCHC 35.4  30.0 - 36.0 g/dL   RDW 91.4  78.2 - 95.6 %   Platelets 208  150 - 400 K/uL   Neutrophils Relative % 71  43 - 77 %   Neutro Abs 7.9 (*) 1.7 - 7.7 K/uL   Lymphocytes Relative 22  12 - 46 %   Lymphs Abs 2.4  0.7 - 4.0 K/uL   Monocytes Relative 7  3 - 12 %   Monocytes Absolute 0.7  0.1 - 1.0 K/uL   Eosinophils Relative 0  0 - 5 %   Eosinophils Absolute 0.1  0.0 - 0.7 K/uL   Basophils Relative 0  0 - 1 %   Basophils Absolute 0.0  0.0 - 0.1 K/uL  BASIC METABOLIC PANEL      Result Value Range   Sodium 136  135 - 145 mEq/L   Potassium 3.3 (*) 3.5 - 5.1 mEq/L   Chloride 102  96 - 112 mEq/L   CO2 26  19 - 32 mEq/L   Glucose, Bld 75  70 - 99 mg/dL   BUN 11  6 - 23 mg/dL   Creatinine, Ser 2.13  0.50 - 1.10 mg/dL   Calcium 9.2  8.4 - 08.6 mg/dL   GFR calc non Af Amer >90  >90 mL/min   GFR calc Af Amer >90  >90 mL/min  URINALYSIS, ROUTINE W REFLEX MICROSCOPIC      Result Value Range   Color, Urine YELLOW  YELLOW   APPearance CLEAR  CLEAR   Specific Gravity, Urine >1.030 (*) 1.005 - 1.030   pH 6.0  5.0 - 8.0   Glucose, UA 100 (*) NEGATIVE mg/dL   Hgb urine dipstick NEGATIVE  NEGATIVE   Bilirubin Urine NEGATIVE  NEGATIVE   Ketones, ur NEGATIVE  NEGATIVE mg/dL   Protein, ur NEGATIVE  NEGATIVE  mg/dL   Urobilinogen, UA 0.2  0.0 - 1.0 mg/dL   Nitrite NEGATIVE  NEGATIVE   Leukocytes, UA NEGATIVE  NEGATIVE   Labs Reviewed  CBC WITH DIFFERENTIAL - Abnormal; Notable for the following:    WBC 11.1 (*)    RBC 3.65 (*)  Hemoglobin 11.9 (*)    HCT 33.6 (*)    Neutro Abs 7.9 (*)    All other components within normal limits  BASIC METABOLIC PANEL - Abnormal; Notable for the following:    Potassium 3.3 (*)    All other components within normal limits  URINALYSIS, ROUTINE W REFLEX MICROSCOPIC - Abnormal; Notable for the following:    Specific Gravity, Urine >1.030 (*)    Glucose, UA 100 (*)    All other components within normal limits   No results found. No diagnosis found.  MDM  Flank pain,  Probably kidney stone.  Pt to get [place] tomorrow am.  She is [redacted] weeks pregnant.  Pt may need prescription after she gets the ultrasound  The chart was scribed for me under my direct supervision.  I personally performed the history, physical, and medical decision making and all procedures in the evaluation of this patient.Benny Lennert, MD 06/24/13 2351

## 2013-06-24 NOTE — ED Notes (Addendum)
abd and back pain onset 3-4 pm  Unable to urinate, Pt is [redacted] weeks pregnant,  Pain is LLQ and in back with hx of kidney stones  No vaginal bleeding or d/c

## 2013-06-24 NOTE — Telephone Encounter (Signed)
Pt states "thinks she has a kidney stone." c/o "really bad back pain" pt states took tylenol with no improvement. Per Cyril Mourning, NP pt needs to push fluids and go to ER to be evaluated. Pt verbalized understanding.

## 2013-06-25 ENCOUNTER — Ambulatory Visit (HOSPITAL_COMMUNITY)
Admit: 2013-06-25 | Discharge: 2013-06-25 | Disposition: A | Discharge status: Home / Self Care | Payer: BC Managed Care – PPO | Attending: Emergency Medicine | Admitting: Emergency Medicine

## 2013-06-25 DIAGNOSIS — Z87442 Personal history of urinary calculi: Secondary | ICD-10-CM | POA: Insufficient documentation

## 2013-06-25 DIAGNOSIS — R109 Unspecified abdominal pain: Secondary | ICD-10-CM | POA: Insufficient documentation

## 2013-06-25 NOTE — ED Provider Notes (Signed)
Pt informed of negative renal US results Pt advised that if she does not feel improved she is welcome to check in at anytime for re-evaluation  Joya Gaskins, MD 06/25/13 1121

## 2013-06-25 NOTE — ED Notes (Signed)
Discharge instructions reviewed with pt, questions answered. Pt verbalized understanding.  

## 2013-06-26 ENCOUNTER — Ambulatory Visit (INDEPENDENT_AMBULATORY_CARE_PROVIDER_SITE_OTHER): Payer: BC Managed Care – PPO | Admitting: Adult Health

## 2013-06-26 ENCOUNTER — Other Ambulatory Visit (HOSPITAL_COMMUNITY)
Admission: RE | Admit: 2013-06-26 | Discharge: 2013-06-26 | Disposition: A | Discharge status: Home / Self Care | Payer: BC Managed Care – PPO | Source: Ambulatory Visit | Attending: Adult Health | Admitting: Adult Health

## 2013-06-26 ENCOUNTER — Encounter: Payer: Self-pay | Admitting: Adult Health

## 2013-06-26 VITALS — BP 100/80 | Wt 115.0 lb

## 2013-06-26 DIAGNOSIS — Z331 Pregnant state, incidental: Secondary | ICD-10-CM

## 2013-06-26 DIAGNOSIS — Z87442 Personal history of urinary calculi: Secondary | ICD-10-CM

## 2013-06-26 DIAGNOSIS — Z01419 Encounter for gynecological examination (general) (routine) without abnormal findings: Secondary | ICD-10-CM | POA: Insufficient documentation

## 2013-06-26 DIAGNOSIS — Z1389 Encounter for screening for other disorder: Secondary | ICD-10-CM

## 2013-06-26 DIAGNOSIS — Z113 Encounter for screening for infections with a predominantly sexual mode of transmission: Secondary | ICD-10-CM | POA: Insufficient documentation

## 2013-06-26 DIAGNOSIS — O99019 Anemia complicating pregnancy, unspecified trimester: Secondary | ICD-10-CM

## 2013-06-26 DIAGNOSIS — Z3490 Encounter for supervision of normal pregnancy, unspecified, unspecified trimester: Secondary | ICD-10-CM | POA: Insufficient documentation

## 2013-06-26 LAB — POCT URINALYSIS DIPSTICK: Ketones, UA: NEGATIVE

## 2013-06-26 NOTE — Progress Notes (Signed)
No complaints at this time. Pt states did go to [facility] ER for kidney stone, HX of frequent kidney stones.

## 2013-06-26 NOTE — Patient Instructions (Addendum)
Pregnancy - Second Trimester The second trimester of pregnancy (3 to 6 months) is a period of rapid growth for you and your baby. At the end of the sixth month, your baby is about 9 inches long and weighs 1 1/2 pounds. You will begin to feel the baby move between 18 and 20 weeks of the pregnancy. This is called quickening. Weight gain is faster. A clear fluid (colostrum) may leak out of your breasts. You may feel small contractions of the womb (uterus). This is known as false labor or Braxton-Hicks contractions. This is like a practice for labor when the baby is ready to be born. Usually, the problems with morning sickness have usually passed by the end of your first trimester. Some women develop small dark blotches (called cholasma, mask of pregnancy) on their face that usually goes away after the baby is born. Exposure to the sun makes the blotches worse. Acne may also develop in some pregnant women and pregnant women who have acne, may find that it goes away. PRENATAL EXAMS  Blood work may continue to be done during prenatal exams. These tests are done to check on your health and the probable health of your baby. Blood work is used to follow your blood levels (hemoglobin). Anemia (low hemoglobin) is common during pregnancy. Iron and vitamins are given to help prevent this. You will also be checked for diabetes between 24 and 28 weeks of the pregnancy. Some of the previous blood tests may be repeated.  The size of the uterus is measured during each visit. This is to make sure that the baby is continuing to grow properly according to the dates of the pregnancy.  Your blood pressure is checked every prenatal visit. This is to make sure you are not getting toxemia.  Your urine is checked to make sure you do not have an infection, diabetes or protein in the urine.  Your weight is checked often to make sure gains are happening at the suggested rate. This is to ensure that both you and your baby are growing  normally.  Sometimes, an ultrasound is performed to confirm the proper growth and development of the baby. This is a test which bounces harmless sound waves off the baby so your caregiver can more accurately determine due dates. Sometimes, a test is done on the amniotic fluid surrounding the baby. This test is called an amniocentesis. The amniotic fluid is obtained by sticking a needle into the belly (abdomen). This is done to check the chromosomes in instances where there is a concern about possible genetic problems with the baby. It is also sometimes done near the end of pregnancy if an early delivery is required. In this case, it is done to help make sure the baby's lungs are mature enough for the baby to live outside of the womb. CHANGES OCCURING IN THE SECOND TRIMESTER OF PREGNANCY Your body goes through many changes during pregnancy. They vary from person to person. Talk to your caregiver about changes you notice that you are concerned about.  During the second trimester, you will likely have an increase in your appetite. It is normal to have cravings for certain foods. This varies from person to person and pregnancy to pregnancy.  Your lower abdomen will begin to bulge.  You may have to urinate more often because the uterus and baby are pressing on your bladder. It is also common to get more bladder infections during pregnancy. You can help this by drinking lots of fluids  and emptying your bladder before and after intercourse.  You may begin to get stretch marks on your hips, abdomen, and breasts. These are normal changes in the body during pregnancy. There are no exercises or medicines to take that prevent this change.  You may begin to develop swollen and bulging veins (varicose veins) in your legs. Wearing support hose, elevating your feet for 15 minutes, 3 to 4 times a day and limiting salt in your diet helps lessen the problem.  Heartburn may develop as the uterus grows and pushes up  against the stomach. Antacids recommended by your caregiver helps with this problem. Also, eating smaller meals 4 to 5 times a day helps.  Constipation can be treated with a stool softener or adding bulk to your diet. Drinking lots of fluids, and eating vegetables, fruits, and whole grains are helpful.  Exercising is also helpful. If you have been very active up until your pregnancy, most of these activities can be continued during your pregnancy. If you have been less active, it is helpful to start an exercise program such as walking.  Hemorrhoids may develop at the end of the second trimester. Warm sitz baths and hemorrhoid cream recommended by your caregiver helps hemorrhoid problems.  Backaches may develop during this time of your pregnancy. Avoid heavy lifting, wear low heal shoes, and practice good posture to help with backache problems.  Some pregnant women develop tingling and numbness of their hand and fingers because of swelling and tightening of ligaments in the wrist (carpel tunnel syndrome). This goes away after the baby is born.  As your breasts enlarge, you may have to get a bigger bra. Get a comfortable, cotton, support bra. Do not get a nursing bra until the last month of the pregnancy if you will be nursing the baby.  You may get a dark line from your belly button to the pubic area called the linea nigra.  You may develop rosy cheeks because of increase blood flow to the face.  You may develop spider looking lines of the face, neck, arms, and chest. These go away after the baby is born. HOME CARE INSTRUCTIONS   It is extremely important to avoid all smoking, herbs, alcohol, and unprescribed drugs during your pregnancy. These chemicals affect the formation and growth of the baby. Avoid these chemicals throughout the pregnancy to ensure the delivery of a healthy infant.  Most of your home care instructions are the same as suggested for the first trimester of your pregnancy.  Keep your caregiver's appointments. Follow your caregiver's instructions regarding medicine use, exercise, and diet.  During pregnancy, you are providing food for you and your baby. Continue to eat regular, well-balanced meals. Choose foods such as meat, fish, milk and other low fat dairy products, vegetables, fruits, and whole-grain breads and cereals. Your caregiver will tell you of the ideal weight gain.  A physical sexual relationship may be continued up until near the end of pregnancy if there are no other problems. Problems could include early (premature) leaking of amniotic fluid from the membranes, vaginal bleeding, abdominal pain, or other medical or pregnancy problems.  Exercise regularly if there are no restrictions. Check with your caregiver if you are unsure of the safety of some of your exercises. The greatest weight gain will occur in the last 2 trimesters of pregnancy. Exercise will help you:  Control your weight.  Get you in shape for labor and delivery.  Lose weight after you have the baby.  Wear  a good support or jogging bra for breast tenderness during pregnancy. This may help if worn during sleep. Pads or tissues may be used in the bra if you are leaking colostrum.  Do not use hot tubs, steam rooms or saunas throughout the pregnancy.  Wear your seat belt at all times when driving. This protects you and your baby if you are in an accident.  Avoid raw meat, uncooked cheese, cat litter boxes, and soil used by cats. These carry germs that can cause birth defects in the baby.  The second trimester is also a good time to visit your dentist for your dental health if this has not been done yet. Getting your teeth cleaned is okay. Use a soft toothbrush. Brush gently during pregnancy.  It is easier to leak urine during pregnancy. Tightening up and strengthening the pelvic muscles will help with this problem. Practice stopping your urination while you are going to the bathroom.  These are the same muscles you need to strengthen. It is also the muscles you would use as if you were trying to stop from passing gas. You can practice tightening these muscles up 10 times a set and repeating this about 3 times per day. Once you know what muscles to tighten up, do not perform these exercises during urination. It is more likely to contribute to an infection by backing up the urine.  Ask for help if you have financial, counseling, or nutritional needs during pregnancy. Your caregiver will be able to offer counseling for these needs as well as refer you for other special needs.  Your skin may become oily. If so, wash your face with mild soap, use non-greasy moisturizer and oil or cream based makeup. MEDICINES AND DRUG USE IN PREGNANCY  Take prenatal vitamins as directed. The vitamin should contain 1 milligram of folic acid. Keep all vitamins out of reach of children. Only a couple vitamins or tablets containing iron may be fatal to a baby or young child when ingested.  Avoid use of all medicines, including herbs, over-the-counter medicines, not prescribed or suggested by your caregiver. Only take over-the-counter or prescription medicines for pain, discomfort, or fever as directed by your caregiver. Do not use aspirin.  Let your caregiver also know about herbs you may be using.  Alcohol is related to a number of birth defects. This includes fetal alcohol syndrome. All alcohol, in any form, should be avoided completely. Smoking will cause low birth rate and premature babies.  Street or illegal drugs are very harmful to the baby. They are absolutely forbidden. A baby born to an addicted mother will be addicted at birth. The baby will go through the same withdrawal an adult does. SEEK MEDICAL CARE IF:  You have any concerns or worries during your pregnancy. It is better to call with your questions if you feel they cannot wait, rather than worry about them. SEEK IMMEDIATE MEDICAL CARE  IF:   An unexplained oral temperature above 102 F (38.9 C) develops, or as your caregiver suggests.  You have leaking of fluid from the vagina (birth canal). If leaking membranes are suspected, take your temperature and tell your caregiver of this when you call.  There is vaginal spotting, bleeding, or passing clots. Tell your caregiver of the amount and how many pads are used. Light spotting in pregnancy is common, especially following intercourse.  You develop a bad smelling vaginal discharge with a change in the color from clear to white.  You continue to feel  sick to your stomach (nauseated) and have no relief from remedies suggested. You vomit blood or coffee ground-like materials.  You lose more than 2 pounds of weight or gain more than 2 pounds of weight over 1 week, or as suggested by your caregiver.  You notice swelling of your face, hands, feet, or legs.  You get exposed to [place] measles and have never had them.  You are exposed to fifth disease or chickenpox.  You develop belly (abdominal) pain. Round ligament discomfort is a common non-cancerous (benign) cause of abdominal pain in pregnancy. Your caregiver still must evaluate you.  You develop a bad headache that does not go away.  You develop fever, diarrhea, pain with urination, or shortness of breath.  You develop visual problems, blurry, or double vision.  You fall or are in a car accident or any kind of trauma.  There is mental or physical violence at home. Document Released: 11/22/2001 Document Revised: 08/22/2012 Document Reviewed: 05/27/2009 [facility] Patient Information 2014 [place], [place]. Follow up in 4 weeks for 2nd IT

## 2013-06-26 NOTE — Progress Notes (Signed)
  Subjective:    Christine Day is a 22 y.o. G1P0 Caucasian female at [redacted]w[redacted]d by [place] being seen today for her first obstetrical visit.  Her obstetrical history is significant for kidney stones.  Pregnancy history fully reviewed.   Patient reports no complaints.  Filed Vitals:   06/26/13 1051  BP: 100/80  Weight: 115 lb (52.164 kg)    HISTORY: OB History   Grav Para Term Preterm Abortions TAB SAB Ect Mult Living   1              # Outc Date GA Lbr Len/2nd Wgt Sex Del Anes PTL Lv   1 CUR              Past Medical History  Diagnosis Date  . Kidney stone   . Pregnant    History reviewed. No pertinent past surgical history. Family History  Problem Relation Age of Onset  . Diabetes Father   . Diabetes Maternal Grandmother   . COPD Maternal Grandmother   . COPD Maternal Grandfather   . Diabetes Paternal Grandfather   . Cancer Paternal Grandfather     lung cancer     Exam    Pelvic Exam:    Perineum: Normal Perineum   Vulva: normal   Vagina:  normal mucosa, normal discharge, no palpable nodules   Uterus   12 week size     Cervix: normal   Adnexa: Not palpable   Urinary: urethral meatus normal    System:     Skin: normal coloration and turgor, no rashes    Neurologic: oriented, normal mood   Extremities: normal strength, tone, and muscle mass   HEENT PERRLA   Mouth/Teeth mucous membranes moist   Cardiovascular: regular rate and rhythm   Respiratory:  appears well, vitals normal, no respiratory distress, acyanotic, normal RR   Abdomen: soft, non-tender    Thin prep pap smear with GC/CHL screening   Assessment:    Pregnancy: G1P0 Patient Active Problem List   Diagnosis Date Noted  . Pregnant 06/26/2013      [redacted]w[redacted]d G1P0 New OB visit    Plan:     Initial labs drawn 06/21/13 Continue prenatal vitamins Problem list reviewed and updated Reviewed n/v relief measures and warning s/s to report Reviewed recommended weight gain based on pre-gravid  BMI Encouraged well-balanced diet Genetic Screening discussed Integrated Screen: requested Cystic fibrosis screening discussed requested Ultrasound discussed; fetal survey: requested Follow up in 4 weeks for 2nd IT and see Dr Despina Hidden Push fluids and cal any kidney stone problems  Azalee Weimer 06/26/2013 11:58 AM

## 2013-07-04 ENCOUNTER — Emergency Department (HOSPITAL_COMMUNITY)
Admission: EM | Admit: 2013-07-04 | Discharge: 2013-07-05 | Disposition: A | Discharge status: Home / Self Care | Payer: BC Managed Care – PPO | Attending: Emergency Medicine | Admitting: Emergency Medicine

## 2013-07-04 ENCOUNTER — Encounter (HOSPITAL_COMMUNITY): Payer: Self-pay | Admitting: Emergency Medicine

## 2013-07-04 DIAGNOSIS — Y9289 Other specified places as the place of occurrence of the external cause: Secondary | ICD-10-CM | POA: Insufficient documentation

## 2013-07-04 DIAGNOSIS — S61011A Laceration without foreign body of right thumb without damage to nail, initial encounter: Secondary | ICD-10-CM

## 2013-07-04 DIAGNOSIS — W268XXA Contact with other sharp object(s), not elsewhere classified, initial encounter: Secondary | ICD-10-CM | POA: Insufficient documentation

## 2013-07-04 DIAGNOSIS — Y9389 Activity, other specified: Secondary | ICD-10-CM | POA: Insufficient documentation

## 2013-07-04 DIAGNOSIS — S61209A Unspecified open wound of unspecified finger without damage to nail, initial encounter: Secondary | ICD-10-CM | POA: Insufficient documentation

## 2013-07-04 DIAGNOSIS — Z87442 Personal history of urinary calculi: Secondary | ICD-10-CM | POA: Insufficient documentation

## 2013-07-04 DIAGNOSIS — Z87891 Personal history of nicotine dependence: Secondary | ICD-10-CM | POA: Insufficient documentation

## 2013-07-04 NOTE — ED Notes (Signed)
Patient states was moving a picture frame and it broke and she lacerated the base of her thumb.

## 2013-07-05 MED ORDER — LIDOCAINE HCL (PF) 1 % IJ SOLN
INTRAMUSCULAR | Status: AC
  Administered 2013-07-05: 01:00:00
  Filled 2013-07-05: qty 5

## 2013-07-05 NOTE — ED Notes (Signed)
Discharge instructions reviewed with pt, questions answered. Pt verbalized understanding.  

## 2013-07-05 NOTE — ED Provider Notes (Signed)
CSN: [id number]     Arrival date & time 07/04/13  2335 History     First MD Initiated Contact with Patient 07/04/13 2352     Chief Complaint  Patient presents with  . Laceration   (Consider location/radiation/quality/duration/timing/severity/associated sxs/prior Treatment) Patient is a 22 y.o. female presenting with skin laceration. The history is provided by the patient.  Laceration Location:  Finger Finger laceration location:  L thumb Length (cm):  2 Depth:  Through dermis Quality: straight   Bleeding: controlled   Laceration mechanism:  Broken glass Pain details:    Quality:  Aching   Severity:  Mild   Timing:  Constant   Progression:  Unchanged Foreign body present:  No foreign bodies Relieved by:  Nothing Worsened by:  Nothing tried Ineffective treatments:  None tried Tetanus status:  Up to date   Past Medical History  Diagnosis Date  . Kidney stone   . Pregnant    History reviewed. No pertinent past surgical history. Family History  Problem Relation Age of Onset  . Diabetes Father   . Diabetes Maternal Grandmother   . COPD Maternal Grandmother   . COPD Maternal Grandfather   . Diabetes Paternal Grandfather   . Cancer Paternal Grandfather     lung cancer   History  Substance Use Topics  . Smoking status: Former Smoker    Types: Cigarettes    Quit date: 06/23/2013  . Smokeless tobacco: Not on file  . Alcohol Use: No   OB History   Grav Para Term Preterm Abortions TAB SAB Ect Mult Living   1              Review of Systems  Constitutional: Negative for fever and chills.  Musculoskeletal: Negative for back pain, joint swelling and arthralgias.  Skin: Positive for wound.       Laceration   Neurological: Negative for dizziness, weakness and numbness.  Hematological: Does not bruise/bleed easily.  All other systems reviewed and are negative.    Allergies  Hydrocodone  Home Medications   Current Outpatient Rx  Name  Route  Sig  Dispense   Refill  . acetaminophen (TYLENOL) 325 MG tablet   Oral   Take 650 mg by mouth every 6 (six) hours as needed for pain.         Marland Kitchen PRENATAL VITAMINS PO   Oral   Take 1 tablet by mouth daily.          BP 111/77  Pulse 98  Temp(Src) 98.4 F (36.9 C) (Oral)  Resp 18  Ht 5\' 6"  (1.676 m)  Wt 114 lb (51.71 kg)  BMI 18.41 kg/m2  SpO2 99%  LMP 04/07/2013 Physical Exam  Nursing note and vitals reviewed. Constitutional: She is oriented to person, place, and time. She appears well-developed and well-nourished. No distress.  HENT:  Head: Normocephalic and atraumatic.  Cardiovascular: Normal rate, regular rhythm, normal heart sounds and intact distal pulses.   No murmur heard. Pulmonary/Chest: Effort normal and breath sounds normal. No respiratory distress.  Musculoskeletal: She exhibits tenderness. She exhibits no edema.       Right hand: She exhibits tenderness and laceration. She exhibits normal range of motion, no bony tenderness, normal two-point discrimination, normal capillary refill, no deformity and no swelling. Normal sensation noted. Decreased strength noted. She exhibits no finger abduction, no thumb/finger opposition and no wrist extension trouble.       Hands: Neurological: She is alert and oriented to person, place, and time. She exhibits  normal muscle tone. Coordination normal.  Skin: Skin is warm and dry. Laceration noted.  See MS exam    ED Course   Procedures (including critical care time)  Labs Reviewed - No data to display No results found. No diagnosis found.  MDM    LACERATION REPAIR Performed by: Annalia Metzger L. Authorized by: Maxwell Caul Consent: Verbal consent obtained. Risks and benefits: risks, benefits and alternatives were discussed Consent given by: patient Patient identity confirmed: provided demographic data Prepped and Draped in normal sterile fashion Wound explored  Laceration Location: base of the right thumb Laceration Length:  2 cm  No Foreign Bodies seen or palpated  Anesthesia: local infiltration  Local anesthetic: lidocaine 1% w/o epinephrine  Anesthetic total: 3 ml  Irrigation method: syringe Amount of cleaning: standard  Skin closure: 4-0 prolene Number of sutures: 5 Technique: simple interrupted  Patient tolerance: Patient tolerated the procedure well with no immediate complications.  Finger was bandaged.  Sutures out in 10 days.  Pt agrees to elevate, keep bandaged and to return here for any signs of infection  Kaniah Rizzolo L. Trisha Mangle, PA-C 07/06/13 1539

## 2013-07-08 NOTE — ED Provider Notes (Signed)
Medical screening examination/treatment/procedure(s) were performed by non-physician practitioner and as supervising physician I was immediately available for consultation/collaboration.  Shaleigh Laubscher S. Mardell Cragg, MD 07/08/13 2347 

## 2013-07-24 ENCOUNTER — Encounter: Payer: Self-pay | Admitting: Advanced Practice Midwife

## 2013-07-24 ENCOUNTER — Other Ambulatory Visit: Payer: Self-pay | Admitting: Obstetrics & Gynecology

## 2013-07-24 ENCOUNTER — Ambulatory Visit (INDEPENDENT_AMBULATORY_CARE_PROVIDER_SITE_OTHER): Payer: BC Managed Care – PPO | Admitting: Advanced Practice Midwife

## 2013-07-24 VITALS — BP 110/60 | Wt 114.0 lb

## 2013-07-24 DIAGNOSIS — Z331 Pregnant state, incidental: Secondary | ICD-10-CM

## 2013-07-24 DIAGNOSIS — Z1389 Encounter for screening for other disorder: Secondary | ICD-10-CM

## 2013-07-24 DIAGNOSIS — O99019 Anemia complicating pregnancy, unspecified trimester: Secondary | ICD-10-CM

## 2013-07-24 DIAGNOSIS — Z3490 Encounter for supervision of normal pregnancy, unspecified, unspecified trimester: Secondary | ICD-10-CM

## 2013-07-24 DIAGNOSIS — N200 Calculus of kidney: Secondary | ICD-10-CM | POA: Insufficient documentation

## 2013-07-24 LAB — POCT URINALYSIS DIPSTICK
Glucose, UA: NEGATIVE
Ketones, UA: NEGATIVE
Leukocytes, UA: NEGATIVE

## 2013-07-24 MED ORDER — BUTALBITAL-APAP-CAFFEINE 50-325-40 MG PO TABS: 1.0000 | ORAL_TABLET | Freq: Four times a day (QID) | ORAL | Status: DC | PRN

## 2013-07-24 NOTE — Progress Notes (Signed)
FOR 2 IT TODAY. C/C HEADACHE FOR LAST 3 DAY.  Tylenol isn't helping. Rx Fioricet prn.  Getting 2nd IT today. Routine questions about pregnancy answered.  F/U in 3 weeks for anatomy scan.

## 2013-07-29 LAB — MATERNAL SCREEN, INTEGRATED #2
Crown Rump Length: 52.4 mm
Estriol Mom: 0.83
Estriol, Free: 0.9 ng/mL
Inhibin A Dimeric: 216 pg/mL
MSS Down Syndrome: <1:5000 {titer}
MSS Trisomy 18 Risk: <1:5000 {titer}
PAPP-A MoM: 0.84
PAPP-A: 743 ng/mL
Rish for ONTD: <1:5000 {titer}
hCG, Serum: 28.7 IU/mL

## 2013-08-14 ENCOUNTER — Encounter: Payer: BC Managed Care – PPO | Admitting: Advanced Practice Midwife

## 2013-08-14 ENCOUNTER — Other Ambulatory Visit: Payer: BC Managed Care – PPO

## 2013-08-15 ENCOUNTER — Encounter: Payer: BC Managed Care – PPO | Admitting: Advanced Practice Midwife

## 2013-08-20 ENCOUNTER — Emergency Department (HOSPITAL_COMMUNITY)
Admission: EM | Admit: 2013-08-20 | Discharge: 2013-08-20 | Disposition: A | Discharge status: Home / Self Care | Payer: BC Managed Care – PPO | Attending: Emergency Medicine | Admitting: Emergency Medicine

## 2013-08-20 ENCOUNTER — Encounter (HOSPITAL_COMMUNITY): Payer: Self-pay | Admitting: *Deleted

## 2013-08-20 ENCOUNTER — Telehealth: Payer: Self-pay | Admitting: Adult Health

## 2013-08-20 ENCOUNTER — Encounter: Payer: BC Managed Care – PPO | Admitting: Adult Health

## 2013-08-20 DIAGNOSIS — L259 Unspecified contact dermatitis, unspecified cause: Secondary | ICD-10-CM | POA: Insufficient documentation

## 2013-08-20 DIAGNOSIS — M7989 Other specified soft tissue disorders: Secondary | ICD-10-CM | POA: Insufficient documentation

## 2013-08-20 DIAGNOSIS — L309 Dermatitis, unspecified: Secondary | ICD-10-CM

## 2013-08-20 DIAGNOSIS — Z87442 Personal history of urinary calculi: Secondary | ICD-10-CM | POA: Insufficient documentation

## 2013-08-20 DIAGNOSIS — Z87891 Personal history of nicotine dependence: Secondary | ICD-10-CM | POA: Insufficient documentation

## 2013-08-20 DIAGNOSIS — J069 Acute upper respiratory infection, unspecified: Secondary | ICD-10-CM | POA: Insufficient documentation

## 2013-08-20 LAB — URINALYSIS, ROUTINE W REFLEX MICROSCOPIC
Bilirubin Urine: NEGATIVE
Glucose, UA: NEGATIVE mg/dL
Hgb urine dipstick: NEGATIVE
Ketones, ur: NEGATIVE mg/dL
Protein, ur: NEGATIVE mg/dL
pH: 7.5 (ref 5.0–8.0)

## 2013-08-20 LAB — COMPREHENSIVE METABOLIC PANEL
AST: 40 U/L — ABNORMAL HIGH (ref 0–37)
BUN: 6 mg/dL (ref 6–23)
CO2: 25 mEq/L (ref 19–32)
Calcium: 9.4 mg/dL (ref 8.4–10.5)
Chloride: 103 mEq/L (ref 96–112)
Creatinine, Ser: 0.49 mg/dL — ABNORMAL LOW (ref 0.50–1.10)
GFR calc Af Amer: >90 mL/min (ref >90)
GFR calc non Af Amer: >90 mL/min (ref >90)
Glucose, Bld: 93 mg/dL (ref 70–99)
Total Bilirubin: 0.2 mg/dL — ABNORMAL LOW (ref 0.3–1.2)

## 2013-08-20 LAB — CBC WITH DIFFERENTIAL/PLATELET
Eosinophils Absolute: 0.1 10*3/uL (ref 0.0–0.7)
Eosinophils Relative: 1 % (ref 0–5)
HCT: 31.1 % — ABNORMAL LOW (ref 36.0–46.0)
Lymphocytes Relative: 12 % (ref 12–46)
Lymphs Abs: 1.7 10*3/uL (ref 0.7–4.0)
MCH: 32.6 pg (ref 26.0–34.0)
MCV: 96.6 fL (ref 78.0–100.0)
Monocytes Absolute: 0.8 10*3/uL (ref 0.1–1.0)
Monocytes Relative: 6 % (ref 3–12)
Platelets: 162 10*3/uL (ref 150–400)
RBC: 3.22 MIL/uL — ABNORMAL LOW (ref 3.87–5.11)

## 2013-08-20 MED ORDER — TRIAMCINOLONE ACETONIDE 0.1 % EX CREA: TOPICAL_CREAM | Freq: Two times a day (BID) | CUTANEOUS | Status: DC

## 2013-08-20 NOTE — Telephone Encounter (Signed)
Er at [facility] called at 3:30 pm pt had appt here but she went to Er for Bump on chin, that Pa thinks is bug bite and is giving her topical steroid for it.She also complained of headache and swelling her WBC 14,000, negative strep for throat, AST 40 urine negative and BP 114/54 and she is to f/u here 9/11 at 1:30 pm. appt here cancelled today since she was in ER.

## 2013-08-20 NOTE — ED Notes (Signed)
FHT  147 low mid abd

## 2013-08-20 NOTE — ED Notes (Addendum)
Pt states that she was bitten by a spider a week ago, c/o of body aches, headaches, sore throat, denies any n/v, does have an appointment with obgyn for the spider bite this afternoon at 3 but felt that the er could see her sooner due to her having to work today,

## 2013-08-20 NOTE — ED Provider Notes (Signed)
CSN: [id number]     Arrival date & time 08/20/13  1133 History   First MD Initiated Contact with Patient 08/20/13 1250     Chief Complaint  Patient presents with  . Insect Bite   (Consider location/radiation/quality/duration/timing/severity/associated sxs/prior Treatment) HPI Comments: Christine Day is a 22 y.o. Female who is currently [redacted] weeks pregnant,  Followed by Dr Emelda Fear,  With multiple complaints.  She describes intermittent persistent bilateral headache for the past 2 weeks which have not responded to tylenol or the fioricet she was prescribed by Dr. Emelda Fear.   Additionally,  Has developed sore throat with noted white spots on her tonsils,  Nasal congestion with clear drainage and has had intermittent episodes of edema which she describes from her hips to her feet,  Worsened after a day at work (in [employer]othing) during which she stands for 8 hours and becoming progressively worse since last week.  Also,  1 week ago she woke with a knot underneath her chin which she thought maybe started from an spider bite as she found a large spider in her bedroom.  Since then the lesion as developed small blisters and is tender but also itching.  She has found no alleviators for this rash.She denies focal weakness, dizziness, numbness, visual changes, nausea, vomiting, fevers, body aches or chills.     The history is provided by the patient.    Past Medical History  Diagnosis Date  . Pregnant   . Kidney stone    History reviewed. No pertinent past surgical history. Family History  Problem Relation Age of Onset  . Diabetes Father   . Diabetes Maternal Grandmother   . COPD Maternal Grandmother   . COPD Maternal Grandfather   . Diabetes Paternal Grandfather   . Cancer Paternal Grandfather     lung cancer   History  Substance Use Topics  . Smoking status: Former Smoker    Types: Cigarettes    Quit date: 06/23/2013  . Smokeless tobacco: Not on file  . Alcohol Use: No   OB History   Grav Para Term Preterm Abortions TAB SAB Ect Mult Living   1              Review of Systems  Constitutional: Negative for fever and chills.  HENT: Positive for congestion, sore throat and rhinorrhea. Negative for ear pain, neck pain and ear discharge.   Eyes: Negative.  Negative for photophobia and visual disturbance.  Respiratory: Negative for chest tightness and shortness of breath.   Cardiovascular: Positive for leg swelling. Negative for chest pain.  Gastrointestinal: Negative for nausea and abdominal pain.  Genitourinary: Negative.   Musculoskeletal: Negative for myalgias, joint swelling and arthralgias.  Skin: Positive for rash. Negative for wound.  Neurological: Positive for headaches. Negative for dizziness, weakness, light-headedness and numbness.  Psychiatric/Behavioral: Negative.     Allergies  Hydrocodone  Home Medications   Current Outpatient Rx  Name  Route  Sig  Dispense  Refill  . butalbital-acetaminophen-caffeine (FIORICET) 50-325-40 MG per tablet   Oral   Take 1-2 tablets by mouth every 6 (six) hours as needed for headache.   20 tablet   0   . PRENATAL VITAMINS PO   Oral   Take 1 tablet by mouth daily.         Marland Kitchen triamcinolone cream (KENALOG) 0.1 %   Topical   Apply topically 2 (two) times daily.   15 g   0    BP 114/54  Pulse 75  Temp(Src) 98.3 F (36.8 C) (Oral)  Resp 20  Ht 5\' 6"  (1.676 m)  Wt 123 lb 7 oz (55.991 kg)  BMI 19.93 kg/m2  SpO2 100%  LMP 04/07/2013 Physical Exam  Nursing note and vitals reviewed. Constitutional: She is oriented to person, place, and time. She appears well-developed and well-nourished.  HENT:  Head: Normocephalic and atraumatic.  Right Ear: Tympanic membrane and ear canal normal.  Left Ear: Tympanic membrane and ear canal normal.  Nose: Mucosal edema and rhinorrhea present.  Mouth/Throat: Uvula is midline, oropharynx is clear and moist and mucous membranes are normal. No oropharyngeal exudate, posterior  oropharyngeal edema, posterior oropharyngeal erythema or tonsillar abscesses.  Eyes: Conjunctivae and EOM are normal. Pupils are equal, round, and reactive to light.  Neck: Normal range of motion. Neck supple.  Cardiovascular: Normal rate and normal heart sounds.   Pulmonary/Chest: Effort normal. No respiratory distress. She has no wheezes. She has no rales.  Abdominal: Soft. There is no tenderness.  Musculoskeletal: Normal range of motion. She exhibits no edema.  Lymphadenopathy:    She has no cervical adenopathy.  Neurological: She is alert and oriented to person, place, and time. She has normal strength. No sensory deficit. Gait normal. GCS eye subscore is 4. GCS verbal subscore is 5. GCS motor subscore is 6.  Normal heel-shin, normal rapid alternating movements. Cranial nerves III-XII intact.  No pronator drift.  Skin: Skin is warm and dry. Rash noted.  Small scab inferior chin with surrounding clear fluid filled vesicles.  No erythema,  No edema or induration.  Psychiatric: She has a normal mood and affect. Her speech is normal and behavior is normal. Thought content normal. Cognition and memory are normal.    ED Course  Procedures (including critical care time) Labs Review Labs Reviewed  CBC WITH DIFFERENTIAL - Abnormal; Notable for the following:    WBC 14.1 (*)    RBC 3.22 (*)    Hemoglobin 10.5 (*)    HCT 31.1 (*)    Neutrophils Relative % 82 (*)    Neutro Abs 11.6 (*)    All other components within normal limits  COMPREHENSIVE METABOLIC PANEL - Abnormal; Notable for the following:    Creatinine, Ser 0.49 (*)    Albumin 3.2 (*)    AST 40 (*)    Total Bilirubin 0.2 (*)    All other components within normal limits  RAPID STREP SCREEN  CULTURE, GROUP A STREP  URINALYSIS, ROUTINE W REFLEX MICROSCOPIC   Imaging Review No results found.  MDM   1. Acute URI   2. Dermatitis    Suspect viral syndrome with pharygitis/nasal/sinus congestion. No sx suggestive of acute  sinusitis.   Rash under chin suggestive of localized allergic reaction,  Possibly originating with insect or spider bite.  Pt was prescribed triamcinolone topical for bid use.  Labs reviewed and stable.  Spoke with Cyril Mourning NP with [facility] (pt was scheduled to see them this pm, instead came to the ed).  appt rescheduled for 9/11 at 1:30 pm for recheck of rash and other sx.  Pt understands and agrees.  Encouraged rest,  Increased fluids,  Elevate legs when possible to help minimize swelling.  Tylenol prn headache.    Burgess Amor, PA-C 08/20/13 2200

## 2013-08-20 NOTE — Telephone Encounter (Signed)
Spoke with pt. Has a bump on chin. Tender to touch. Getting bigger, now as big as a quarter. Swelling in legs also. Appt scheduled for pt to come in. JSY

## 2013-08-20 NOTE — ED Notes (Addendum)
Pt is [redacted] wks pregnant,  Has submandibular area that she says is spider bite app a1 week ago, sore throat with exudate, ears hurt, says her legs were swollen last week , but are better now. Body aches,

## 2013-08-22 ENCOUNTER — Encounter: Payer: Self-pay | Admitting: Adult Health

## 2013-08-22 ENCOUNTER — Other Ambulatory Visit: Payer: BC Managed Care – PPO

## 2013-08-22 ENCOUNTER — Ambulatory Visit (INDEPENDENT_AMBULATORY_CARE_PROVIDER_SITE_OTHER): Payer: Self-pay | Admitting: Adult Health

## 2013-08-22 ENCOUNTER — Encounter: Payer: BC Managed Care – PPO | Admitting: Advanced Practice Midwife

## 2013-08-22 VITALS — BP 112/60 | Wt 119.0 lb

## 2013-08-22 DIAGNOSIS — O99019 Anemia complicating pregnancy, unspecified trimester: Secondary | ICD-10-CM

## 2013-08-22 DIAGNOSIS — O98519 Other viral diseases complicating pregnancy, unspecified trimester: Secondary | ICD-10-CM

## 2013-08-22 DIAGNOSIS — B029 Zoster without complications: Secondary | ICD-10-CM | POA: Insufficient documentation

## 2013-08-22 DIAGNOSIS — Z1389 Encounter for screening for other disorder: Secondary | ICD-10-CM

## 2013-08-22 HISTORY — DX: Zoster without complications: B02.9

## 2013-08-22 LAB — POCT URINALYSIS DIPSTICK
Blood, UA: 5
Glucose, UA: NEGATIVE
Nitrite, UA: NEGATIVE

## 2013-08-22 LAB — CULTURE, GROUP A STREP

## 2013-08-22 MED ORDER — ACYCLOVIR 800 MG PO TABS: ORAL_TABLET | ORAL | Status: DC

## 2013-08-22 NOTE — Progress Notes (Signed)
Pt here today to follow up from an ER visit. She was treated for a reaction to a spider bite, sore throat and headaches. Pt denies any headache or swelling today. Pt denies any other problems or concerns at this time.

## 2013-08-22 NOTE — Progress Notes (Signed)
Has rash under skin x 1 week has vesicles and looks like shingles, will rx acyclovir 800 mg 5 x daily x 7 days #35, was seen in ER for ?bug bite and swelling and sore throat and all that has resolved.call prn, Dr Despina Hidden in to co exam, return as sceduled

## 2013-08-22 NOTE — ED Provider Notes (Signed)
Medical screening examination/treatment/procedure(s) were performed by non-physician practitioner and as supervising physician I was immediately available for consultation/collaboration.   Benny Lennert, MD 08/22/13 [phone]

## 2013-08-22 NOTE — Patient Instructions (Addendum)
Shingles Shingles (herpes zoster) is an infection that is caused by the same virus that causes chickenpox (varicella). The infection causes a painful skin rash and fluid-filled blisters, which eventually break open, crust over, and heal. It may occur in any area of the body, but it usually affects only one side of the body or face. The pain of shingles usually lasts about 1 month. However, some people with shingles may develop long-term (chronic) pain in the affected area of the body. Shingles often occurs many years after the person had chickenpox. It is more common:  In people older than 50 years.  In people with weakened immune systems, such as those with HIV, AIDS, or cancer.  In people taking medicines that weaken the immune system, such as transplant medicines.  In people under great stress. CAUSES  Shingles is caused by the varicella zoster virus (VZV), which also causes chickenpox. After a person is infected with the virus, it can remain in the person's body for years in an inactive state (dormant). To cause shingles, the virus reactivates and breaks out as an infection in a nerve root. The virus can be spread from person to person (contagious) through contact with open blisters of the shingles rash. It will only spread to people who have not had chickenpox. When these people are exposed to the virus, they may develop chickenpox. They will not develop shingles. Once the blisters scab over, the person is no longer contagious and cannot spread the virus to others. SYMPTOMS  Shingles shows up in stages. The initial symptoms may be pain, itching, and tingling in an area of the skin. This pain is usually described as burning, stabbing, or throbbing.In a few days or weeks, a painful red rash will appear in the area where the pain, itching, and tingling were felt. The rash is usually on one side of the body in a band or belt-like pattern. Then, the rash usually turns into fluid-filled blisters. They  will scab over and dry up in approximately 2 3 weeks. Flu-like symptoms may also occur with the initial symptoms, the rash, or the blisters. These may include:  Fever.  Chills.  Headache.  Upset stomach. DIAGNOSIS  Your caregiver will perform a skin exam to diagnose shingles. Skin scrapings or fluid samples may also be taken from the blisters. This sample will be examined under a microscope or sent to a lab for further testing. TREATMENT  There is no specific cure for shingles. Your caregiver will likely prescribe medicines to help you manage the pain, recover faster, and avoid long-term problems. This may include antiviral drugs, anti-inflammatory drugs, and pain medicines. HOME CARE INSTRUCTIONS   Take a cool bath or apply cool compresses to the area of the rash or blisters as directed. This may help with the pain and itching.   Only take over-the-counter or prescription medicines as directed by your caregiver.   Rest as directed by your caregiver.  Keep your rash and blisters clean with mild soap and cool water or as directed by your caregiver.  Do not pick your blisters or scratch your rash. Apply an anti-itch cream or numbing creams to the affected area as directed by your caregiver.  Keep your shingles rash covered with a loose bandage (dressing).  Avoid skin contact with:  Babies.   Pregnant women.   Children with eczema.   Elderly people with transplants.   People with chronic illnesses, such as leukemia or AIDS.   Wear loose-fitting clothing to help ease   the pain of material rubbing against the rash.  Keep all follow-up appointments with your caregiver.If the area involved is on your face, you may receive a referral for follow-up to a specialist, such as an eye doctor (ophthalmologist) or an ear, nose, and throat (ENT) doctor. Keeping all follow-up appointments will help you avoid eye complications, chronic pain, or disability.  SEEK IMMEDIATE MEDICAL  CARE IF:   You have facial pain, pain around the eye area, or loss of feeling on one side of your face.  You have ear pain or ringing in your ear.  You have loss of taste.  Your pain is not relieved with prescribed medicines.   Your redness or swelling spreads.   You have more pain and swelling.  Your condition is worsening or has changed.   You have a feveror persistent symptoms for more than 2 3 days.  You have a fever and your symptoms suddenly get worse. MAKE SURE YOU:  Understand these instructions.  Will watch your condition.  Will get help right away if you are not doing well or get worse. Document Released: 11/28/2005 Document Revised: 08/22/2012 Document Reviewed: 07/12/2012 [facility] Patient Information 2014 [place], [place]. Take meds all gone

## 2013-08-28 ENCOUNTER — Encounter: Payer: BC Managed Care – PPO | Admitting: Adult Health

## 2013-08-28 ENCOUNTER — Ambulatory Visit (INDEPENDENT_AMBULATORY_CARE_PROVIDER_SITE_OTHER): Payer: Self-pay | Admitting: Adult Health

## 2013-08-28 ENCOUNTER — Encounter: Payer: Self-pay | Admitting: Adult Health

## 2013-08-28 ENCOUNTER — Other Ambulatory Visit: Payer: Self-pay | Admitting: Advanced Practice Midwife

## 2013-08-28 ENCOUNTER — Ambulatory Visit (INDEPENDENT_AMBULATORY_CARE_PROVIDER_SITE_OTHER): Payer: BC Managed Care – PPO

## 2013-08-28 VITALS — BP 108/70 | Wt 121.0 lb

## 2013-08-28 DIAGNOSIS — Z1389 Encounter for screening for other disorder: Secondary | ICD-10-CM

## 2013-08-28 DIAGNOSIS — O4402 Placenta previa specified as without hemorrhage, second trimester: Secondary | ICD-10-CM

## 2013-08-28 DIAGNOSIS — Z331 Pregnant state, incidental: Secondary | ICD-10-CM

## 2013-08-28 DIAGNOSIS — O4400 Placenta previa specified as without hemorrhage, unspecified trimester: Secondary | ICD-10-CM

## 2013-08-28 DIAGNOSIS — O4401 Placenta previa specified as without hemorrhage, first trimester: Secondary | ICD-10-CM

## 2013-08-28 DIAGNOSIS — Z363 Encounter for antenatal screening for malformations: Secondary | ICD-10-CM

## 2013-08-28 DIAGNOSIS — O98519 Other viral diseases complicating pregnancy, unspecified trimester: Secondary | ICD-10-CM

## 2013-08-28 DIAGNOSIS — Z3490 Encounter for supervision of normal pregnancy, unspecified, unspecified trimester: Secondary | ICD-10-CM

## 2013-08-28 DIAGNOSIS — O99019 Anemia complicating pregnancy, unspecified trimester: Secondary | ICD-10-CM

## 2013-08-28 HISTORY — DX: Complete placenta previa nos or without hemorrhage, unspecified trimester: O44.00

## 2013-08-28 NOTE — Progress Notes (Signed)
U/S(21+1wks)-active fetus, meas c/w dates, fluid wnl, post gr 0 plac, Partial Previa noted, cx long and closed, bilateral adnexa wnl, female fetus, no major abnl noted, would like to reck placenta previa at 28 weeks

## 2013-08-28 NOTE — Progress Notes (Signed)
No complaints at this time. Pt unable to obtain urine sample.

## 2013-08-28 NOTE — Patient Instructions (Addendum)
Placenta Previa Placenta previa is a condition in which the placenta has grown low in the womb (uterus). This is a condition in which the organ which connects the fetus to the mother's uterus (placenta) is low in the opening in the uterus (cervix). It can partially or completely cover the cervix. The cause of this is unknown. It is more common with multiple births or twins. SYMPTOMS  The main symptom or sign of placenta previa is vaginal bleeding. The bleeding can be mild to very heavy. This condition can be very serious for the mother and baby. Often there are no symptoms with placenta previa. Sometimes if the location of the placenta is very low it will become partially detached and cause bleeding. This may be simply a marginal sinus separation of the placenta. This is a separation of the vessels from the wall of the uterus. This may cause no further problems other than mild anxiety. There is an increase risk of intrauterine growth restriction (IUGR) with placenta previa because of the abnormal placement of the placenta. DIAGNOSIS  The diagnosis is usually made by ultrasound exam of the uterus. There may be a careful vaginal exam to see the cervix. The patient will be prepared for a Cesarean section immediately if necessary. TREATMENT  Treatment for placenta previa is usually bed rest in the hospital or at home. You may be given medication to stop contractions. Contractions can increase bleeding. Your doctor may take fluid from the baby's sac (amniocentesis) to see if the baby's lungs are mature enough for a C-section. A blood transfusion may be necessary if you have a low blood count. No further treatment may be needed when placenta previa is present in small degrees. Early placenta previa may resolve on it's own. The placenta moves higher in the birth canal as pregnancy progresses. In this case the placenta no longer is an obstruction to birth. The position of the placenta may need to be reconfirmed  during pregnancy. This can be done with an ultrasound exam of the belly(abdomen). Call your caregiver immediately if blood loss is severe. Immediate fluid or blood replacement may be necessary. With complete placenta previa, the only way to safely deliver the baby is by Cesarean section. HOME CARE INSTRUCTIONS   Follow your caregiver's advice about bed rest.  Take any iron pills or other medications your doctor gives to you.  No bending or lifting.  Do not have sexual intercourse.  Do not put anything in your vagina (tampons or vaginal creams). If you are bleeding, use sanitary pads.  Keep your doctors appointments as scheduled. Not keeping the appointment could result in a chronic or permanent injury, pain, disability and injury or death to you or your unborn baby. If there is any problem keeping the appointment, you must call back to this facility for assistance. SEEK IMMEDIATE MEDICAL CARE IF:   You have increased bleeding.  You have fainting episodes or feel lightheaded.  You develop abdominal pain.  You can no longer feel normal fetal or baby movements.  You develop uterine contractions. Document Released: 11/28/2005 Document Revised: 02/20/2012 Document Reviewed: 07/11/2008 [facility] Patient Information 2014 [place], [place]. No sex  follow up in 4 weeks  Will get [place] at 28 weeks to check previa

## 2013-08-28 NOTE — Progress Notes (Signed)
Area under chin drying up has no complaints, no bleeding has felt baby movement.follow up in 4 weeks then at 28 weeks will get [place] to recheck previa

## 2013-09-26 ENCOUNTER — Encounter: Payer: Self-pay | Admitting: Obstetrics & Gynecology

## 2013-09-26 ENCOUNTER — Encounter (INDEPENDENT_AMBULATORY_CARE_PROVIDER_SITE_OTHER): Payer: Self-pay

## 2013-09-26 ENCOUNTER — Ambulatory Visit (INDEPENDENT_AMBULATORY_CARE_PROVIDER_SITE_OTHER): Payer: BC Managed Care – PPO | Admitting: Obstetrics & Gynecology

## 2013-09-26 ENCOUNTER — Ambulatory Visit (INDEPENDENT_AMBULATORY_CARE_PROVIDER_SITE_OTHER): Payer: BC Managed Care – PPO

## 2013-09-26 VITALS — BP 120/70 | Wt 127.5 lb

## 2013-09-26 DIAGNOSIS — O98519 Other viral diseases complicating pregnancy, unspecified trimester: Secondary | ICD-10-CM

## 2013-09-26 DIAGNOSIS — O4401 Placenta previa specified as without hemorrhage, first trimester: Secondary | ICD-10-CM

## 2013-09-26 DIAGNOSIS — O4400 Placenta previa specified as without hemorrhage, unspecified trimester: Secondary | ICD-10-CM

## 2013-09-26 DIAGNOSIS — Z331 Pregnant state, incidental: Secondary | ICD-10-CM

## 2013-09-26 DIAGNOSIS — O4412 Placenta previa with hemorrhage, second trimester: Secondary | ICD-10-CM

## 2013-09-26 DIAGNOSIS — Z1389 Encounter for screening for other disorder: Secondary | ICD-10-CM

## 2013-09-26 DIAGNOSIS — O4420 Partial placenta previa NOS or without hemorrhage, unspecified trimester: Secondary | ICD-10-CM | POA: Insufficient documentation

## 2013-09-26 DIAGNOSIS — Z23 Encounter for immunization: Secondary | ICD-10-CM

## 2013-09-26 DIAGNOSIS — O99019 Anemia complicating pregnancy, unspecified trimester: Secondary | ICD-10-CM

## 2013-09-26 LAB — POCT URINALYSIS DIPSTICK
Ketones, UA: NEGATIVE
Leukocytes, UA: NEGATIVE

## 2013-09-26 MED ORDER — INFLUENZA VAC SPLIT QUAD 0.5 ML IM SUSP
0.5000 mL | Freq: Once | INTRAMUSCULAR | Status: AC
  Administered 2013-09-26: 0.5 mL via INTRAMUSCULAR

## 2013-09-26 NOTE — Progress Notes (Signed)
U/S(25+2wks)-vtx active fetus, meas c/w dates, fluid wnl, post gr 0 plac (marginal previa plac tip measures 1.8cm from internal cx os), EFW 1 lb 11 oz (42nd%tile), female fetus

## 2013-09-26 NOTE — Progress Notes (Deleted)
Follow up US.

## 2013-09-26 NOTE — Progress Notes (Signed)
Sonogram reviewed, please see report, placenta now low lying, otherwise normal, still recommend pelvic/sexual rest BP weight and urine results all reviewed and noted. Patient reports good fetal movement, denies any bleeding and no rupture of membranes symptoms or regular contractions. Patient is without complaints. All questions were answered.

## 2013-10-04 ENCOUNTER — Emergency Department (HOSPITAL_COMMUNITY)
Admission: EM | Admit: 2013-10-04 | Discharge: 2013-10-04 | Disposition: A | Discharge status: Home / Self Care | Payer: BC Managed Care – PPO | Attending: Emergency Medicine | Admitting: Emergency Medicine

## 2013-10-04 ENCOUNTER — Encounter (HOSPITAL_COMMUNITY): Payer: Self-pay | Admitting: Emergency Medicine

## 2013-10-04 ENCOUNTER — Emergency Department (HOSPITAL_COMMUNITY): Payer: BC Managed Care – PPO

## 2013-10-04 DIAGNOSIS — M549 Dorsalgia, unspecified: Secondary | ICD-10-CM | POA: Insufficient documentation

## 2013-10-04 DIAGNOSIS — Z3490 Encounter for supervision of normal pregnancy, unspecified, unspecified trimester: Secondary | ICD-10-CM

## 2013-10-04 DIAGNOSIS — O9989 Other specified diseases and conditions complicating pregnancy, childbirth and the puerperium: Secondary | ICD-10-CM | POA: Insufficient documentation

## 2013-10-04 DIAGNOSIS — Z87442 Personal history of urinary calculi: Secondary | ICD-10-CM | POA: Insufficient documentation

## 2013-10-04 DIAGNOSIS — Z8619 Personal history of other infectious and parasitic diseases: Secondary | ICD-10-CM | POA: Insufficient documentation

## 2013-10-04 DIAGNOSIS — R109 Unspecified abdominal pain: Secondary | ICD-10-CM

## 2013-10-04 DIAGNOSIS — Z87891 Personal history of nicotine dependence: Secondary | ICD-10-CM | POA: Insufficient documentation

## 2013-10-04 LAB — URINALYSIS, ROUTINE W REFLEX MICROSCOPIC
Glucose, UA: NEGATIVE mg/dL
Leukocytes, UA: NEGATIVE
Nitrite: NEGATIVE
Specific Gravity, Urine: 1.025 (ref 1.005–1.030)
pH: 6 (ref 5.0–8.0)

## 2013-10-04 MED ORDER — ONDANSETRON HCL 4 MG/2ML IJ SOLN
4.0000 mg | Freq: Once | INTRAMUSCULAR | Status: AC
  Administered 2013-10-04: 4 mg via INTRAVENOUS
  Filled 2013-10-04: qty 2

## 2013-10-04 MED ORDER — SODIUM CHLORIDE 0.9 % IV SOLN
Freq: Once | INTRAVENOUS | Status: AC
  Administered 2013-10-04: 1000 mL via INTRAVENOUS

## 2013-10-04 MED ORDER — OXYCODONE-ACETAMINOPHEN 5-325 MG PO TABS
1.0000 | ORAL_TABLET | Freq: Once | ORAL | Status: AC
  Administered 2013-10-04: 1 via ORAL
  Filled 2013-10-04: qty 1

## 2013-10-04 MED ORDER — OXYCODONE-ACETAMINOPHEN 5-325 MG PO TABS: 1.0000 | ORAL_TABLET | ORAL | Status: DC | PRN

## 2013-10-04 MED ORDER — MORPHINE SULFATE 4 MG/ML IJ SOLN
4.0000 mg | Freq: Once | INTRAMUSCULAR | Status: AC
  Administered 2013-10-04: 4 mg via INTRAVENOUS
  Filled 2013-10-04: qty 1

## 2013-10-04 MED ORDER — DIPHENHYDRAMINE HCL 50 MG/ML IJ SOLN
25.0000 mg | Freq: Once | INTRAMUSCULAR | Status: AC
Start: 2013-10-04 — End: 2013-10-04
  Administered 2013-10-04: 25 mg via INTRAVENOUS
  Filled 2013-10-04: qty 1

## 2013-10-04 NOTE — ED Notes (Signed)
Pt reports back pain that started a few days ago & became constant tonight while at work tonight. Pt states when she went to restroom tonight she noticed some pink in toilet. Next time she went looked clear.

## 2013-10-04 NOTE — ED Provider Notes (Signed)
CSN: [id number]     Arrival date & time 10/04/13  0506 History   First MD Initiated Contact with Patient 10/04/13 [phone]     Chief Complaint  Patient presents with  . Back Pain   (Consider location/radiation/quality/duration/timing/severity/associated sxs/prior Treatment) The history is provided by the patient.   22 year old female who is [redacted] weeks pregnant has been having left flank pain for the last several days which got worse tonight. Pain is worse if she sits up straight but nothing actually makes it better. Pain is both sharp and dull and she rates it at 8/10. There is no associated nausea or vomiting. She denies urinary urgency, frequency, tenesmus, dysuria. She has felt fetal movement. She has not taken anything for pain. She does have history of kidney stones and is worried that she has another kidney stone.  Past Medical History  Diagnosis Date  . Pregnant   . Kidney stone   . Shingles 08/22/2013    Under chin  . Placenta previa 08/28/2013   No past surgical history on file. Family History  Problem Relation Age of Onset  . Diabetes Father   . Diabetes Maternal Grandmother   . COPD Maternal Grandmother   . COPD Maternal Grandfather   . Diabetes Paternal Grandfather   . Cancer Paternal Grandfather     lung cancer   History  Substance Use Topics  . Smoking status: Former Smoker    Types: Cigarettes    Quit date: 06/23/2013  . Smokeless tobacco: Never Used  . Alcohol Use: No   OB History   Grav Para Term Preterm Abortions TAB SAB Ect Mult Living   1              Review of Systems  All other systems reviewed and are negative.    Allergies  Hydrocodone  Home Medications   Current Outpatient Rx  Name  Route  Sig  Dispense  Refill  . butalbital-acetaminophen-caffeine (FIORICET) 50-325-40 MG per tablet   Oral   Take 1-2 tablets by mouth every 6 (six) hours as needed for headache.   20 tablet   0   . PRENATAL VITAMINS PO   Oral   Take 1 tablet by mouth  daily.          BP 113/66  Pulse 91  Temp(Src) 97.9 F (36.6 C) (Oral)  Resp 20  Ht 5\' 6"  (1.676 m)  Wt 129 lb (58.514 kg)  BMI 20.83 kg/m2  SpO2 100%  LMP 04/07/2013 Physical Exam  Nursing note and vitals reviewed.  22 year old female, resting comfortably and in no acute distress. Vital signs are normal. Oxygen saturation is 100%, which is normal. Head is normocephalic and atraumatic. PERRLA, EOMI. Oropharynx is clear. Neck is nontender and supple without adenopathy or JVD. Back is nontender in the midline. There is moderate left CVA tenderness. Lungs are clear without rales, wheezes, or rhonchi. Chest is nontender. Heart has regular rate and rhythm without murmur. Abdomen is soft, flat, nontender without masses or hepatosplenomegaly and peristalsis is normoactive. Gravid uterus is present with size consistent with dates. Uterus is nontender. Extremities have 1+ edema, full range of motion is present. Skin is warm and dry without rash. Neurologic: Mental status is normal, cranial nerves are intact, there are no motor or sensory deficits.  ED Course  Procedures (including critical care time) Labs Review Results for orders placed during the hospital encounter of 10/04/13  URINALYSIS, ROUTINE W REFLEX MICROSCOPIC      Result  Value Range   Color, Urine YELLOW  YELLOW   APPearance CLEAR  CLEAR   Specific Gravity, Urine 1.025  1.005 - 1.030   pH 6.0  5.0 - 8.0   Glucose, UA NEGATIVE  NEGATIVE mg/dL   Hgb urine dipstick NEGATIVE  NEGATIVE   Bilirubin Urine NEGATIVE  NEGATIVE   Ketones, ur NEGATIVE  NEGATIVE mg/dL   Protein, ur NEGATIVE  NEGATIVE mg/dL   Urobilinogen, UA 0.2  0.0 - 1.0 mg/dL   Nitrite NEGATIVE  NEGATIVE   Leukocytes, UA NEGATIVE  NEGATIVE    MDM   1. Left flank pain   2. Pregnancy    Left flank pain of uncertain cause. Old records are reviewed and she did have a kidney stone in 2009 she's had 2 negative renal ultrasounds since then. Because of  pregnancy, CT scan should not be done. She will be sent for renal ultrasound. In the meantime, she is given morphine for pain. Ketorolac cannot be given because of stage of pregnancy.  Urinalysis is come back negative for. Renal ultrasound will be obtained this morning. Case is endorsed to Dr. Effie Shy to followup on ultrasound reports. Fetal monitoring has been unremarkable per [facility] [place].  Dione Booze, MD 10/04/13 [phone]

## 2013-10-04 NOTE — ED Notes (Signed)
To see Dr. Emelda Fear Monday, 10/27 at 2:45

## 2013-10-04 NOTE — ED Provider Notes (Signed)
Christine Day is a 22 y.o. female who is here for evaluation of left flank pain, worsening over the last several days. The pain is intermittent. She last had a kidney stone problems several months ago. She is in her early surgeon Konrad Dolores of an uncomplicated pregnancy. She denies vaginal bleeding or discharge. No nausea or vomiting. She was initially seen by Dr. Preston Fleeting who evaluated and treated her. The patient states her pain is improving, but is still present. She had some itching after the morphine, which has improved after treatment, with Benadryl. She was evaluated at 08 15. At this time. There is no visible rash and she was not scratching. Nursing has reported that the fetal heart monitor, has been reassuring, when viewed by the staff at the [facility]. Repeat vital signs are noted. She was given Percocet and reassessed; she feels better. She is informed of the follow up appt.    Patient Vitals for the past 24 hrs:  BP Temp Temp src Pulse Resp SpO2 Height Weight  10/04/13 0807 - 98.5 F (36.9 C) Oral - - - - -  10/04/13 0705 111/47 mmHg - - 94 16 99 % - -  10/04/13 0523 113/66 mmHg 97.9 F (36.6 C) Oral 91 20 100 % 5\' 6"  (1.676 m) 129 lb (58.514 kg)   Medications  0.9 %  sodium chloride infusion ( Intravenous Stopped 10/04/13 0815)  morphine 4 MG/ML injection 4 mg (4 mg Intravenous Given 10/04/13 0556)  ondansetron (ZOFRAN) injection 4 mg (4 mg Intravenous Given 10/04/13 0554)  diphenhydrAMINE (BENADRYL) injection 25 mg (25 mg Intravenous Given 10/04/13 0657)  oxyCODONE-acetaminophen (PERCOCET/ROXICET) 5-325 MG per tablet 1 tablet (1 tablet Oral Given 10/04/13 0815)    US Ob Follow Up  09/26/2013   FOLLOW UP SONOGRAM   Christine Day is in the office for a follow up sonogram for reck  placenta previa.  She is a 22 y.o. year old G1P0 with Estimated Date of Delivery: 01/07/14 by  early ultrasound now at  [redacted]w[redacted]d weeks gestation. Thus far the pregnancy has  been complicated by  posterior placenta previa.  GESTATION: SINGLETON  PRESENTATION: cephalic  FETAL ACTIVITY:          Heart rate         131 bpm          The fetus is active.  AMNIOTIC FLUID: The amniotic fluid volume is  normal,   PLACENTA LOCALIZATION:  posterior, low lying GRADE 1  CERVIX: Measures 4.9 cm  ADNEXA: The ovaries are normal.   GESTATIONAL AGE AND  BIOMETRICS:  Gestational criteria: Estimated Date of Delivery: 01/07/14 by early  ultrasound now at [redacted]w[redacted]d  Previous Scans:3  GESTATIONAL SAC            mm          weeks  CROWN RUMP LENGTH            mm          weeks  NUCHAL TRANSLUCENCY            mm           BIPARIETAL DIAMETER           6.18 cm         25+1 weeks  HEAD CIRCUMFERENCE           23.45 cm         25+3 weeks  ABDOMINAL CIRCUMFERENCE  20.26 cm         24+6 weeks  FEMUR LENGTH           4.46 cm         24+5 weeks                                                           AVERAGE EGA(BY THIS SCAN):   25+0 weeks                                                 ESTIMATED FETAL WEIGHT:        753  grams, 42 % ANATOMICAL SURVEY                                                                             COMMENTS CEREBRAL VENTRICLES yes normal   CHOROID PLEXUS yes normal   CEREBELLUM yes normal   CISTERNA MAGNA yes normal   NUCHAL REGION yes normal   ORBITS yes normal   NASAL BONE yes normal   NOSE/LIP yes normal   FACIAL PROFILE yes normal   4 CHAMBERED HEART yes normal   OUTFLOW TRACTS yes normal   DIAPHRAGM yes normal   STOMACH yes normal   RENAL REGION yes normal   BLADDER yes normal   CORD INSERTION yes normal   3 VESSEL CORD yes normal   SPINE yes normal   ARMS/HANDS yes normal   LEGS/FEET yes normal   GENITALIA yes normal female        SUSPECTED ABNORMALITIES:  no  QUALITY OF SCAN: satisfactory  TECHNICIAN COMMENTS:  U/S(25+2wks)-vtx active fetus, meas c/w dates, fluid wnl, post gr 0 plac  (marginal previa plac tip measures 1.8cm from internal cx os), EFW 1 lb 11  oz (42nd%tile), female fetus       A copy of  this report including all images has been saved and backed up to  a second source for retrieval if needed. All measures and details of the  anatomical scan, placentation, fluid volume and pelvic anatomy are  contained in that report.  Chari Manning 09/26/2013 10:31 AM  Clinical Impression and recommendations:  I have reviewed the sonogram results above, combined with the patient's  current clinical course, below are my impressions and any appropriate  recommendations for management based on the sonographic findings.  1.  G1P0 Estimated Date of Delivery: 01/07/14 by  early ultrasound and  confirmed by today's sonographic dating 2.  Normal fetal sonographic findings, specifically placenta has  "migrated" and is now just low lying 1.8 cm from os.  Recheck at about 32  weeks or so. Otherwise normal detailed anatomical survey and appropriate growth 3.  Normal general sonographic findings  Recommend routine prenatal care based on this sonogram or as clinically  indicated  EURE,LUTHER H 09/26/2013 11:05 AM  US Renal  10/04/2013   CLINICAL DATA:  Flank pain  EXAM: RETROPERITONEAL ULTRASOUND  COMPARISON:  June 25, 2013  FINDINGS: The right kidney measures 13.1 cm in length. Left kidney measures 12.5 cm in length. There is moderate pelvicaliectasis on the right with slight pelvicaliectasis on the left. There is no renal mass or perinephric fluid on either side. No sonographically demonstrable calculus or ureterectasis is appreciated. Renal cortical thickness and echogenicity are normal bilaterally.  No lesion is seen by ultrasound in the urinary bladder. The patient is able to empty her bladder of nearly completely.  The moderate fullness of the right renal collecting system persists after waiting.  There is an intrauterine gestation present.  IMPRESSION: Moderate pelvicaliectasis on the right. Rather minimal pelvicaliectasis on the left. The fullness on the right persists after patient empties her bladder. A  distal calculus on the right cannot be excluded sonographically. The study otherwise is unremarkable.   Electronically Signed   By: Bretta Bang M.D.   On: 10/04/2013 07:43   Assessment: Nonspecific flank pain. The ultrasound abnormality is not consistent with her complaint of left flank pain, in that there is more dilation of the right kidney. Because of this and her significant improvement. She can be managed expectantly and followup with her OB to arrange further management and treatment.  Nursing Notes Reviewed/ Care Coordinated, and agree without changes. Applicable Imaging Reviewed.  Interpretation of Laboratory Data incorporated into ED treatment   Plan: Home Medications- Percocet; Home Treatments and Observation- strain urine; return here if the recommended treatment, does not improve the symptoms; Recommended follow up- OB at 2:45 PM on 10/07/13       Flint Melter, MD 10/04/13 [phone]

## 2013-10-04 NOTE — ED Notes (Signed)
Patient states the benadryl helped her pain more then the pain medicine. Wants to know if she can have something else for pain. RN made aware.

## 2013-10-07 ENCOUNTER — Encounter (HOSPITAL_COMMUNITY): Payer: Self-pay | Admitting: Emergency Medicine

## 2013-10-07 ENCOUNTER — Encounter: Payer: BC Managed Care – PPO | Admitting: Women's Health

## 2013-10-07 ENCOUNTER — Emergency Department (HOSPITAL_COMMUNITY)
Admission: EM | Admit: 2013-10-07 | Discharge: 2013-10-07 | Disposition: A | Discharge status: Home / Self Care | Payer: BC Managed Care – PPO | Attending: Emergency Medicine | Admitting: Emergency Medicine

## 2013-10-07 DIAGNOSIS — O9989 Other specified diseases and conditions complicating pregnancy, childbirth and the puerperium: Secondary | ICD-10-CM | POA: Insufficient documentation

## 2013-10-07 DIAGNOSIS — R51 Headache: Secondary | ICD-10-CM | POA: Insufficient documentation

## 2013-10-07 DIAGNOSIS — O2390 Unspecified genitourinary tract infection in pregnancy, unspecified trimester: Secondary | ICD-10-CM | POA: Insufficient documentation

## 2013-10-07 DIAGNOSIS — Z87891 Personal history of nicotine dependence: Secondary | ICD-10-CM | POA: Insufficient documentation

## 2013-10-07 DIAGNOSIS — Z8619 Personal history of other infectious and parasitic diseases: Secondary | ICD-10-CM | POA: Insufficient documentation

## 2013-10-07 DIAGNOSIS — Z87442 Personal history of urinary calculi: Secondary | ICD-10-CM | POA: Insufficient documentation

## 2013-10-07 DIAGNOSIS — Z79899 Other long term (current) drug therapy: Secondary | ICD-10-CM | POA: Insufficient documentation

## 2013-10-07 DIAGNOSIS — N390 Urinary tract infection, site not specified: Secondary | ICD-10-CM | POA: Insufficient documentation

## 2013-10-07 LAB — URINALYSIS, ROUTINE W REFLEX MICROSCOPIC
Bilirubin Urine: NEGATIVE
Glucose, UA: NEGATIVE mg/dL
Hgb urine dipstick: NEGATIVE
Protein, ur: NEGATIVE mg/dL
pH: 6.5 (ref 5.0–8.0)

## 2013-10-07 LAB — URINE MICROSCOPIC-ADD ON

## 2013-10-07 MED ORDER — CEFTRIAXONE SODIUM 1 G IJ SOLR: 1.0000 g | Freq: Once | INTRAMUSCULAR | Status: DC

## 2013-10-07 MED ORDER — CEPHALEXIN 500 MG PO CAPS: 500.0000 mg | ORAL_CAPSULE | Freq: Four times a day (QID) | ORAL | Status: DC

## 2013-10-07 MED ORDER — HYDROMORPHONE HCL PF 1 MG/ML IJ SOLN
1.0000 mg | Freq: Once | INTRAMUSCULAR | Status: DC
  Filled 2013-10-07: qty 1

## 2013-10-07 NOTE — ED Notes (Signed)
Pt alert & oriented x4, stable gait. Patient given discharge instructions, paperwork & prescription(s). Patient  instructed to stop at the registration desk to finish any additional paperwork. Patient verbalized understanding. Pt left department w/ no further questions. 

## 2013-10-07 NOTE — ED Notes (Signed)
Patient does not have ride home. Dilaudid not given due to this. Patient understood and agreeable.

## 2013-10-07 NOTE — ED Notes (Signed)
Pt reports left flank pain since last Friday, was seen in the ed at that time and had ultrasound (unsure what it showed).  H/o stones. Cont. To have pain to the area.  She is [redacted] weeks pregnant--denies any pregnancy complaints.  Has appointment w/ ob/gyn this afternoon. Also has been taking the meds for pain and has a headache.

## 2013-10-07 NOTE — ED Provider Notes (Signed)
CSN: [id number]     Arrival date & time 10/07/13  4098 History   This chart was scribed for Benny Lennert, MD by Bennett Scrape, ED Scribe. This patient was seen in room APA01/APA01 and the patient's care was started at 8:03 AM.    Chief Complaint  Patient presents with  . Flank Pain  . Headache    Patient is a 22 y.o. female presenting with flank pain. The history is provided by the patient. No language interpreter was used.  Flank Pain This is a new problem. The current episode started more than 2 days ago. The problem occurs constantly. The problem has not changed since onset.Associated symptoms include headaches. Pertinent negatives include no chest pain and no abdominal pain. Nothing aggravates the symptoms. Nothing relieves the symptoms. Treatments tried: Percocet. The treatment provided no relief.    HPI Comments: Christine Day is a 22 y.o. female who is [redacted] weeks pregnant presents to the Emergency Department complaining of constant left flank pain for the past 3 days. She reports having a h/o kidney stones with similar symptoms but states that this episode is longer lasting than prior episodes. She was seen in the ED during the initial onset and had an renal US. Results showed a possible stone in the right kidney. She was discharged in improving conditions with a prescription for Percocet. She states that she has been taking the percocet without improvement. She is back today, because she is unable to sleep secondary to pain. She denies any nausea, emesis or urinary symptoms. She is G1P0. She denies any pregnancy complications and reports positive fetal movement. She states that she has an appointment with her [facility] this afternoon at 2:45p.     Past Medical History  Diagnosis Date  . Pregnant   . Kidney stone   . Shingles 08/22/2013    Under chin  . Placenta previa 08/28/2013   History reviewed. No pertinent past surgical history. Family History  Problem  Relation Age of Onset  . Diabetes Father   . Diabetes Maternal Grandmother   . COPD Maternal Grandmother   . COPD Maternal Grandfather   . Diabetes Paternal Grandfather   . Cancer Paternal Grandfather     lung cancer   History  Substance Use Topics  . Smoking status: Former Smoker    Types: Cigarettes    Quit date: 06/23/2013  . Smokeless tobacco: Never Used  . Alcohol Use: No   OB History   Grav Para Term Preterm Abortions TAB SAB Ect Mult Living   1              Review of Systems  Constitutional: Negative for appetite change and fatigue.  HENT: Negative for congestion, ear discharge and sinus pressure.   Eyes: Negative for discharge.  Respiratory: Negative for cough.   Cardiovascular: Negative for chest pain.  Gastrointestinal: Negative for abdominal pain and diarrhea.  Genitourinary: Positive for flank pain. Negative for frequency and hematuria.  Musculoskeletal: Negative for back pain.  Skin: Negative for rash.  Neurological: Positive for headaches. Negative for seizures.  Psychiatric/Behavioral: Negative for hallucinations.    Allergies  Hydrocodone and Morphine and related  Home Medications   Current Outpatient Rx  Name  Route  Sig  Dispense  Refill  . butalbital-acetaminophen-caffeine (FIORICET) 50-325-40 MG per tablet   Oral   Take 1-2 tablets by mouth every 6 (six) hours as needed for headache.   20 tablet   0   . oxyCODONE-acetaminophen (  PERCOCET) 5-325 MG per tablet   Oral   Take 1 tablet by mouth every 4 (four) hours as needed for pain.   20 tablet   0   . PRENATAL VITAMINS PO   Oral   Take 1 tablet by mouth daily.          Triage Vitals: BP 124/61  Pulse 98  Temp(Src) 98.3 F (36.8 C) (Oral)  Resp 21  Ht 5\' 2"  (1.575 m)  Wt 129 lb (58.514 kg)  BMI 23.59 kg/m2  SpO2 98%  LMP 04/07/2013  Physical Exam  Nursing note and vitals reviewed. Constitutional: She is oriented to person, place, and time. She appears well-developed and  well-nourished.  HENT:  Head: Normocephalic and atraumatic.  Eyes: Conjunctivae and EOM are normal. No scleral icterus.  Neck: Neck supple. No thyromegaly present.  Cardiovascular: Normal rate and regular rhythm.  Exam reveals no gallop and no friction rub.   No murmur heard. Pulmonary/Chest: Effort normal and breath sounds normal. No stridor. She has no wheezes. She has no rales. She exhibits no tenderness.  Abdominal: There is no tenderness.  26 week pregnancy, normal gravid abdomen  Musculoskeletal: Normal range of motion. She exhibits no edema.  Moderate right flank tenderness  Lymphadenopathy:    She has no cervical adenopathy.  Neurological: She is oriented to person, place, and time. She exhibits normal muscle tone. Coordination normal.  Skin: No rash noted. No erythema.  Psychiatric: She has a normal mood and affect. Her behavior is normal.    ED Course  Procedures (including critical care time)  Medications  HYDROmorphone (DILAUDID) injection 1 mg (not administered)    DIAGNOSTIC STUDIES: Oxygen Saturation is 98% on room air, normal by my interpretation.    COORDINATION OF CARE: 8:06 AM-Discussed treatment plan which includes pain medications and UA with pt at bedside and pt agreed to plan.   9:18 AM- Pt was offered a shot of Dilaudid but declined due to having to drive herself home. Informed pt of lab work results showing a kidney infection. Discussed discharge plan which includes antibiotics with pt and pt agreed to plan. Also advised pt to follow up with OB for pregnancy safe pain medication as needed and pt agreed. Addressed symptoms to return for with pt.   Labs Review Labs Reviewed  URINALYSIS, ROUTINE W REFLEX MICROSCOPIC - Abnormal; Notable for the following:    Nitrite POSITIVE (*)    Leukocytes, UA TRACE (*)    All other components within normal limits  URINE MICROSCOPIC-ADD ON - Abnormal; Notable for the following:    Bacteria, UA MANY (*)    All other  components within normal limits  URINE CULTURE   Imaging Review No results found.  EKG Interpretation   None       MDM  No diagnosis found.   The chart was scribed for me under my direct supervision.  I personally performed the history, physical, and medical decision making and all procedures in the evaluation of this patient.Benny Lennert, MD 10/07/13 [phone]

## 2013-10-08 LAB — URINE CULTURE: Colony Count: 5000

## 2013-10-15 ENCOUNTER — Encounter: Payer: BC Managed Care – PPO | Admitting: Women's Health

## 2013-10-17 ENCOUNTER — Other Ambulatory Visit: Payer: BC Managed Care – PPO

## 2013-10-17 ENCOUNTER — Telehealth: Payer: Self-pay | Admitting: Obstetrics and Gynecology

## 2013-10-17 ENCOUNTER — Ambulatory Visit (INDEPENDENT_AMBULATORY_CARE_PROVIDER_SITE_OTHER): Payer: BC Managed Care – PPO | Admitting: Obstetrics and Gynecology

## 2013-10-17 VITALS — BP 122/66 | Wt 127.6 lb

## 2013-10-17 DIAGNOSIS — Z3403 Encounter for supervision of normal first pregnancy, third trimester: Secondary | ICD-10-CM

## 2013-10-17 DIAGNOSIS — O99330 Smoking (tobacco) complicating pregnancy, unspecified trimester: Secondary | ICD-10-CM

## 2013-10-17 DIAGNOSIS — O4400 Placenta previa specified as without hemorrhage, unspecified trimester: Secondary | ICD-10-CM

## 2013-10-17 DIAGNOSIS — F17210 Nicotine dependence, cigarettes, uncomplicated: Secondary | ICD-10-CM

## 2013-10-17 DIAGNOSIS — Z1389 Encounter for screening for other disorder: Secondary | ICD-10-CM

## 2013-10-17 DIAGNOSIS — Z331 Pregnant state, incidental: Secondary | ICD-10-CM

## 2013-10-17 DIAGNOSIS — O99019 Anemia complicating pregnancy, unspecified trimester: Secondary | ICD-10-CM

## 2013-10-17 DIAGNOSIS — O98519 Other viral diseases complicating pregnancy, unspecified trimester: Secondary | ICD-10-CM

## 2013-10-17 LAB — POCT URINALYSIS DIPSTICK
Blood, UA: NEGATIVE
Glucose, UA: NEGATIVE

## 2013-10-17 NOTE — Patient Instructions (Signed)
Cigarettes discussed, Please smoke outside.

## 2013-10-17 NOTE — Telephone Encounter (Signed)
Pt requesting work note for November 2 and 3 rd due to pregnancy related illness. Note left at front desk for pt to pick up.

## 2013-10-17 NOTE — Addendum Note (Signed)
Addended by: Colen Darling on: 10/17/2013 09:22 AM   Modules accepted: Orders

## 2013-10-17 NOTE — Progress Notes (Signed)
GTT not done due to small amt sprite consumed this a;m, rescheduled. FOB incarcerated for parole violation, incarcerated til after delivery.

## 2013-10-24 ENCOUNTER — Encounter: Payer: Self-pay | Admitting: Nurse Practitioner

## 2013-10-24 ENCOUNTER — Ambulatory Visit (INDEPENDENT_AMBULATORY_CARE_PROVIDER_SITE_OTHER): Payer: BC Managed Care – PPO | Admitting: Nurse Practitioner

## 2013-10-24 VITALS — BP 118/78 | Ht 66.0 in | Wt 131.6 lb

## 2013-10-24 DIAGNOSIS — F329 Major depressive disorder, single episode, unspecified: Secondary | ICD-10-CM

## 2013-10-24 DIAGNOSIS — F341 Dysthymic disorder: Secondary | ICD-10-CM

## 2013-10-25 ENCOUNTER — Emergency Department (HOSPITAL_COMMUNITY): Payer: BC Managed Care – PPO

## 2013-10-25 ENCOUNTER — Emergency Department (HOSPITAL_COMMUNITY)
Admission: EM | Admit: 2013-10-25 | Discharge: 2013-10-25 | Disposition: A | Discharge status: Home / Self Care | Payer: BC Managed Care – PPO | Attending: Emergency Medicine | Admitting: Emergency Medicine

## 2013-10-25 ENCOUNTER — Other Ambulatory Visit: Payer: Self-pay | Admitting: Nurse Practitioner

## 2013-10-25 ENCOUNTER — Encounter (HOSPITAL_COMMUNITY): Payer: Self-pay | Admitting: Emergency Medicine

## 2013-10-25 DIAGNOSIS — Z87442 Personal history of urinary calculi: Secondary | ICD-10-CM | POA: Insufficient documentation

## 2013-10-25 DIAGNOSIS — Z3490 Encounter for supervision of normal pregnancy, unspecified, unspecified trimester: Secondary | ICD-10-CM

## 2013-10-25 DIAGNOSIS — O2390 Unspecified genitourinary tract infection in pregnancy, unspecified trimester: Secondary | ICD-10-CM | POA: Insufficient documentation

## 2013-10-25 DIAGNOSIS — F17200 Nicotine dependence, unspecified, uncomplicated: Secondary | ICD-10-CM | POA: Insufficient documentation

## 2013-10-25 DIAGNOSIS — Z8619 Personal history of other infectious and parasitic diseases: Secondary | ICD-10-CM | POA: Insufficient documentation

## 2013-10-25 DIAGNOSIS — N390 Urinary tract infection, site not specified: Secondary | ICD-10-CM | POA: Insufficient documentation

## 2013-10-25 DIAGNOSIS — Z8742 Personal history of other diseases of the female genital tract: Secondary | ICD-10-CM | POA: Insufficient documentation

## 2013-10-25 LAB — URINALYSIS, ROUTINE W REFLEX MICROSCOPIC
Bilirubin Urine: NEGATIVE
Glucose, UA: NEGATIVE mg/dL
Hgb urine dipstick: NEGATIVE
Protein, ur: NEGATIVE mg/dL
Specific Gravity, Urine: 1.02 (ref 1.005–1.030)
pH: 6 (ref 5.0–8.0)

## 2013-10-25 LAB — URINE MICROSCOPIC-ADD ON

## 2013-10-25 MED ORDER — FENTANYL CITRATE 0.05 MG/ML IJ SOLN
50.0000 ug | Freq: Once | INTRAMUSCULAR | Status: AC
  Administered 2013-10-25: 50 ug via INTRAVENOUS

## 2013-10-25 MED ORDER — OXYCODONE-ACETAMINOPHEN 5-325 MG PO TABS: 2.0000 | ORAL_TABLET | ORAL | Status: DC | PRN

## 2013-10-25 MED ORDER — FENTANYL CITRATE 0.05 MG/ML IJ SOLN
50.0000 ug | Freq: Once | INTRAMUSCULAR | Status: AC
  Administered 2013-10-25: 50 ug via INTRAVENOUS
  Filled 2013-10-25: qty 2

## 2013-10-25 MED ORDER — DEXTROSE 5 % IV SOLN
1.0000 g | Freq: Once | INTRAVENOUS | Status: AC
  Administered 2013-10-25: 1 g via INTRAVENOUS
  Filled 2013-10-25: qty 10

## 2013-10-25 MED ORDER — ONDANSETRON HCL 4 MG/2ML IJ SOLN
4.0000 mg | Freq: Once | INTRAMUSCULAR | Status: AC
  Administered 2013-10-25: 4 mg via INTRAVENOUS
  Filled 2013-10-25: qty 2

## 2013-10-25 MED ORDER — FENTANYL CITRATE 0.05 MG/ML IJ SOLN
INTRAMUSCULAR | Status: AC
  Filled 2013-10-25: qty 2

## 2013-10-25 MED ORDER — FENTANYL CITRATE 0.05 MG/ML IJ SOLN: 100.0000 ug | Freq: Once | INTRAMUSCULAR | Status: DC

## 2013-10-25 MED ORDER — NITROFURANTOIN MONOHYD MACRO 100 MG PO CAPS: 100.0000 mg | ORAL_CAPSULE | Freq: Two times a day (BID) | ORAL | Status: DC

## 2013-10-25 MED ORDER — ONDANSETRON 4 MG PO TBDP: 4.0000 mg | ORAL_TABLET | Freq: Three times a day (TID) | ORAL | Status: DC | PRN

## 2013-10-25 MED ORDER — ESCITALOPRAM OXALATE 10 MG PO TABS: 10.0000 mg | ORAL_TABLET | Freq: Every day | ORAL | Status: DC

## 2013-10-25 NOTE — Progress Notes (Signed)
Spoke with patient and told her Christine Day contacted her OB office. Medicine called in. Recheck in one month. Stop med and call back sooner if problems. Also please give info for [facility]; Ask for appt with [facility]. Patient verbalized understanding.

## 2013-10-25 NOTE — Progress Notes (Signed)
0454 Per ED RN, pt currently refusing reapplication of fetal and uterine monitors.  Patient advised OB RR not responsible for any negative fetal outcomes if she will not allow monitoring.  Patient likely to be discharged soon.  Renal US appears negative, and urinalysis appears positive.

## 2013-10-25 NOTE — ED Provider Notes (Signed)
Patient does show mild dilatation of the right collecting system on the right side. This is improved versus her most recent comparison. On reexam she appears comfortable and states her pain is improved. Urinalysis shows white blood cells too numerous to count, positive bacteria, positive nitrites. Negative for blood.  This is consistent with her symptoms. Suggest urinary tract infection. She was treated for UTI the end of October. No culture was performed. We'll culture her urine today. She was given IV Rocephin. Will be continued on Macrobid. Pain control with Percocet and Zofran. Asked her to call her OB/GYN on Monday, 3 days from now, to check the results for urinalysis and sure that the infection is sensitive to the chosen antibiotics. Recheck here with any worsening symptoms.  Roney Marion, MD 10/25/13 [phone]

## 2013-10-25 NOTE — ED Provider Notes (Signed)
CSN: [id number]     Arrival date & time 10/25/13  0414 History   First MD Initiated Contact with Patient 10/25/13 [phone]     Chief Complaint  Patient presents with  . Flank Pain    HPI Patient is a G1 P0 at 29 weeks and 3 days pregnant.  She presents with right flank pain radiating down into her right groin.  She states the pain is coming and going.  She has associated nausea without vomiting.  She denies diarrhea.  She's had normal fetal movement through most of the day.  She feels the baby kicking at this time currently.  She states she has a history kidney stones and reports this feels similarly.  She's tried Tylenol at home without improvement in her symptoms.  She denies vaginal bleeding.  No loss of fluid.  No urinary incontinence, urinary frequency, dysuria.  Her pregnancy thus far has been rather uneventful per the patient.  She has had obstetrical care.  Her pain is mild to moderate in severity at this time.  Nothing worsens or improves her symptoms.  She denies rash.  No diarrhea.   Past Medical History  Diagnosis Date  . Pregnant   . Kidney stone   . Shingles 08/22/2013    Under chin  . Placenta previa 08/28/2013   History reviewed. No pertinent past surgical history. Family History  Problem Relation Age of Onset  . Diabetes Father   . Diabetes Maternal Grandmother   . COPD Maternal Grandmother   . COPD Maternal Grandfather   . Diabetes Paternal Grandfather   . Cancer Paternal Grandfather     lung cancer   History  Substance Use Topics  . Smoking status: Current Every Day Smoker    Types: Cigarettes  . Smokeless tobacco: Never Used  . Alcohol Use: No   OB History   Grav Para Term Preterm Abortions TAB SAB Ect Mult Living   1              Review of Systems  All other systems reviewed and are negative.    Allergies  Hydrocodone and Morphine and related  Home Medications   Current Outpatient Rx  Name  Route  Sig  Dispense  Refill  . PRENATAL VITAMINS PO  Oral   Take 1 tablet by mouth daily.         . butalbital-acetaminophen-caffeine (FIORICET) 50-325-40 MG per tablet   Oral   Take 1-2 tablets by mouth every 6 (six) hours as needed for headache.   20 tablet   0   . oxyCODONE-acetaminophen (PERCOCET/ROXICET) 5-325 MG per tablet                BP 106/62  Pulse 99  Temp(Src) 97.5 F (36.4 C) (Oral)  Resp 20  Ht 5\' 6"  (1.676 m)  Wt 131 lb (59.421 kg)  BMI 21.15 kg/m2  SpO2 100%  LMP 04/07/2013 Physical Exam  Nursing note and vitals reviewed. Constitutional: She is oriented to person, place, and time. She appears well-developed and well-nourished. No distress.  HENT:  Head: Normocephalic and atraumatic.  Eyes: EOM are normal.  Neck: Normal range of motion.  Cardiovascular: Normal rate, regular rhythm and normal heart sounds.   Pulmonary/Chest: Effort normal and breath sounds normal.  Abdominal: Soft. She exhibits no distension. There is no tenderness.  Gravid abd consistent with dates  Musculoskeletal: Normal range of motion.  Neurological: She is alert and oriented to person, place, and time.  Skin: Skin is  warm and dry.  Psychiatric: She has a normal mood and affect. Judgment normal.    ED Course  Procedures (including critical care time) Labs Review Labs Reviewed  URINALYSIS, ROUTINE W REFLEX MICROSCOPIC   Imaging Review No results found.  EKG Interpretation   None       MDM  No diagnosis found.   Pain and nausea treated at this time.  Urinalysis pending.  Patient will undergo ultrasound of her kidneys.    Care to Dr Fayrene Fearing, awaiting [place].   Lyanne Co, MD 10/25/13 [phone]

## 2013-10-25 NOTE — ED Notes (Signed)
Pt refusing to get back on fetal monitor.  Pt states " im ready to go"  Notified edp

## 2013-10-25 NOTE — ED Notes (Signed)
Patient c/o right flank pain x 3-4 hours.

## 2013-10-25 NOTE — ED Notes (Signed)
Pt requesting pain medication, states " the last stuff they gave me felt like water in my veins".  Notified edp

## 2013-10-26 LAB — URINE CULTURE: Culture: NO GROWTH

## 2013-10-28 ENCOUNTER — Encounter: Payer: Self-pay | Admitting: Nurse Practitioner

## 2013-10-28 DIAGNOSIS — F329 Major depressive disorder, single episode, unspecified: Secondary | ICD-10-CM | POA: Insufficient documentation

## 2013-10-28 NOTE — Progress Notes (Signed)
Subjective:  Presents for complaints of anxiety that is been overwhelming lately. Patient is in her second trimester of her pregnancy. Works third shift. Her babies father was recently arrested and will have to serve time in prison. Patient has lost her home and had to move back in with her mother. Her mother has been very supportive. Receives prenatal care at [facility]. Has had a migraine recently. Took some Percocet that she had at home, did not discuss this with her obstetrician. Denies suicidal or homicidal thoughts or ideation. Has a remote history of anxiety, according to patient she was on Xanax at one point when she was a teenager.  Objective:   BP 118/78  Ht 5\' 6"  (1.676 m)  Wt 131 lb 9.6 oz (59.693 kg)  BMI 21.25 kg/m2  LMP 04/07/2013 NAD. Alert, oriented. Crying through most of the office visit. Lungs clear. Heart regular rate rhythm.  Assessment: Anxiety and depression  Plan: Consulted with Cyril Mourning, FNP at [facility]. Patient started on Lexapro 10 mg daily. Also strongly recommend that she start mental health counseling, given information. Recheck in a few weeks, call back sooner if any problems. Seek help immediately if any suicidal thoughts or ideation. Advised patient not to take any medication including Percocet unless she discuss this with her obstetrician first

## 2013-10-28 NOTE — Assessment & Plan Note (Signed)
Consulted with Cyril Mourning, FNP at [facility]. Patient started on Lexapro 10 mg daily. Also strongly recommend that she start mental health counseling, given information. Recheck in a few weeks, call back sooner if any problems. Seek help immediately if any suicidal thoughts or ideation. Advised patient not to take any medication including Percocet unless she discuss this with her obstetrician first

## 2013-10-29 ENCOUNTER — Other Ambulatory Visit: Payer: BC Managed Care – PPO

## 2013-11-12 ENCOUNTER — Encounter: Payer: BC Managed Care – PPO | Admitting: Women's Health

## 2013-12-12 NOTE — L&D Delivery Note (Signed)
Delivery Note At 4:23 AM a viable and healthy female was delivered via Vaginal, Spontaneous Delivery (Presentation: ; Occiput Anterior).  APGAR: 9, 9; weight pending.   Placenta status: Intact, Spontaneous.  Cord: 3 vessels with the following complications: None.    Anesthesia: Epidural  Episiotomy: no Lacerations: none Suture Repair: n/a Est. Blood Loss (mL): 300  Mom to postpartum.  Baby to Couplet care / Skin to Skin. Placenta to birthing suites.  Beverely Lowdamo, Elena 12/30/2013, 4:37 AM  I was present for and supervised the delivery of this newborn.  I agree with above documentation.   Kemya Shed, Redmond BasemanKELI L, MD

## 2013-12-26 ENCOUNTER — Ambulatory Visit (INDEPENDENT_AMBULATORY_CARE_PROVIDER_SITE_OTHER): Payer: Self-pay | Admitting: Advanced Practice Midwife

## 2013-12-26 ENCOUNTER — Other Ambulatory Visit: Payer: Self-pay | Admitting: Advanced Practice Midwife

## 2013-12-26 ENCOUNTER — Encounter: Payer: Self-pay | Admitting: Advanced Practice Midwife

## 2013-12-26 VITALS — BP 120/80 | Wt 138.5 lb

## 2013-12-26 DIAGNOSIS — O0933 Supervision of pregnancy with insufficient antenatal care, third trimester: Secondary | ICD-10-CM | POA: Insufficient documentation

## 2013-12-26 DIAGNOSIS — O99330 Smoking (tobacco) complicating pregnancy, unspecified trimester: Secondary | ICD-10-CM

## 2013-12-26 DIAGNOSIS — O98519 Other viral diseases complicating pregnancy, unspecified trimester: Secondary | ICD-10-CM

## 2013-12-26 DIAGNOSIS — O4400 Placenta previa specified as without hemorrhage, unspecified trimester: Secondary | ICD-10-CM

## 2013-12-26 DIAGNOSIS — Z331 Pregnant state, incidental: Secondary | ICD-10-CM

## 2013-12-26 DIAGNOSIS — O26849 Uterine size-date discrepancy, unspecified trimester: Secondary | ICD-10-CM

## 2013-12-26 DIAGNOSIS — O99019 Anemia complicating pregnancy, unspecified trimester: Secondary | ICD-10-CM

## 2013-12-26 DIAGNOSIS — O0930 Supervision of pregnancy with insufficient antenatal care, unspecified trimester: Secondary | ICD-10-CM

## 2013-12-26 DIAGNOSIS — Z3490 Encounter for supervision of normal pregnancy, unspecified, unspecified trimester: Secondary | ICD-10-CM

## 2013-12-26 DIAGNOSIS — Z1389 Encounter for screening for other disorder: Secondary | ICD-10-CM

## 2013-12-26 DIAGNOSIS — O4423 Partial placenta previa NOS or without hemorrhage, third trimester: Secondary | ICD-10-CM

## 2013-12-26 DIAGNOSIS — Z3400 Encounter for supervision of normal first pregnancy, unspecified trimester: Secondary | ICD-10-CM

## 2013-12-26 LAB — POCT URINALYSIS DIPSTICK
GLUCOSE UA: NEGATIVE
KETONES UA: NEGATIVE
Nitrite, UA: NEGATIVE
Protein, UA: NEGATIVE

## 2013-12-26 LAB — CBC
HEMATOCRIT: 34.1 % — AB (ref 36.0–46.0)
Hemoglobin: 11.8 g/dL — ABNORMAL LOW (ref 12.0–15.0)
MCH: 31.4 pg (ref 26.0–34.0)
MCHC: 34.6 g/dL (ref 30.0–36.0)
MCV: 90.7 fL (ref 78.0–100.0)
PLATELETS: 258 10*3/uL (ref 150–400)
RBC: 3.76 MIL/uL — ABNORMAL LOW (ref 3.87–5.11)
RDW: 13 % (ref 11.5–15.5)
WBC: 14.6 10*3/uL — AB (ref 4.0–10.5)

## 2013-12-26 LAB — OB RESULTS CONSOLE GC/CHLAMYDIA
CHLAMYDIA, DNA PROBE: NEGATIVE
Gonorrhea: NEGATIVE

## 2013-12-26 MED ORDER — AMOXICILLIN 500 MG PO CAPS: 500.0000 mg | ORAL_CAPSULE | Freq: Three times a day (TID) | ORAL | Status: DC

## 2013-12-26 NOTE — Progress Notes (Signed)
Has missed several appts, has not done 2 hr gtt.  Will do 1 hour and catch up on labs today.  Has done FBS with mom's meter many times.  FBS 80-90 per pt.  Has a broken off wisdom tooth.  Started taking amoxicillin 2 days ago.  rx enough for 10 days and to go see dentist.  Will schecule u/s asap to check placenta/growth.

## 2013-12-27 LAB — ANTIBODY SCREEN: ANTIBODY SCREEN: NEGATIVE

## 2013-12-27 LAB — GLUCOSE TOLERANCE, 1 HOUR (50G) W/O FASTING: Glucose, 1 Hour GTT: 83 mg/dL (ref 70–140)

## 2013-12-27 LAB — DRUG SCREEN, URINE, NO CONFIRMATION
AMPHETAMINE SCRN UR: NEGATIVE
BARBITURATE QUANT UR: NEGATIVE
Benzodiazepines.: POSITIVE — AB
COCAINE METABOLITES: NEGATIVE
Creatinine,U: 132 mg/dL
Marijuana Metabolite: NEGATIVE
Methadone: NEGATIVE
Opiate Screen, Urine: POSITIVE — AB
Phencyclidine (PCP): NEGATIVE
Propoxyphene: NEGATIVE

## 2013-12-27 LAB — HSV 2 ANTIBODY, IGG: HSV 2 GLYCOPROTEIN G AB, IGG: 10.88 IV — AB

## 2013-12-27 LAB — GLUCOSE TOLERANCE, 1 HOUR

## 2013-12-27 LAB — GC/CHLAMYDIA PROBE AMP
CT PROBE, AMP APTIMA: NEGATIVE
GC Probe RNA: NEGATIVE

## 2013-12-27 LAB — RPR

## 2013-12-27 LAB — OXYCODONE SCREEN, UA, RFLX CONFIRM

## 2013-12-27 LAB — HIV ANTIBODY (ROUTINE TESTING W REFLEX): HIV: NONREACTIVE

## 2013-12-28 LAB — STREP B DNA PROBE: GBSP: NEGATIVE

## 2013-12-30 ENCOUNTER — Encounter (HOSPITAL_COMMUNITY): Payer: Self-pay | Admitting: *Deleted

## 2013-12-30 ENCOUNTER — Telehealth (HOSPITAL_COMMUNITY): Payer: Self-pay | Admitting: Radiology

## 2013-12-30 ENCOUNTER — Inpatient Hospital Stay (HOSPITAL_COMMUNITY): Payer: BC Managed Care – PPO | Admitting: Anesthesiology

## 2013-12-30 ENCOUNTER — Other Ambulatory Visit: Payer: BC Managed Care – PPO

## 2013-12-30 ENCOUNTER — Inpatient Hospital Stay (HOSPITAL_COMMUNITY)
Admission: AD | Admit: 2013-12-30 | Discharge: 2014-01-01 | DRG: 774 | Disposition: A | Discharge status: Home / Self Care | Payer: BC Managed Care – PPO | Source: Ambulatory Visit | Attending: Obstetrics & Gynecology | Admitting: Obstetrics & Gynecology

## 2013-12-30 ENCOUNTER — Encounter (HOSPITAL_COMMUNITY): Payer: BC Managed Care – PPO | Admitting: Anesthesiology

## 2013-12-30 DIAGNOSIS — O4400 Placenta previa specified as without hemorrhage, unspecified trimester: Secondary | ICD-10-CM

## 2013-12-30 DIAGNOSIS — IMO0001 Reserved for inherently not codable concepts without codable children: Secondary | ICD-10-CM

## 2013-12-30 DIAGNOSIS — A6000 Herpesviral infection of urogenital system, unspecified: Secondary | ICD-10-CM | POA: Diagnosis present

## 2013-12-30 DIAGNOSIS — O0933 Supervision of pregnancy with insufficient antenatal care, third trimester: Secondary | ICD-10-CM

## 2013-12-30 DIAGNOSIS — O99344 Other mental disorders complicating childbirth: Secondary | ICD-10-CM

## 2013-12-30 DIAGNOSIS — Z3490 Encounter for supervision of normal pregnancy, unspecified, unspecified trimester: Secondary | ICD-10-CM

## 2013-12-30 DIAGNOSIS — F411 Generalized anxiety disorder: Secondary | ICD-10-CM | POA: Diagnosis present

## 2013-12-30 DIAGNOSIS — R29898 Other symptoms and signs involving the musculoskeletal system: Secondary | ICD-10-CM | POA: Diagnosis not present

## 2013-12-30 DIAGNOSIS — O98519 Other viral diseases complicating pregnancy, unspecified trimester: Secondary | ICD-10-CM | POA: Diagnosis present

## 2013-12-30 DIAGNOSIS — O99334 Smoking (tobacco) complicating childbirth: Secondary | ICD-10-CM | POA: Diagnosis present

## 2013-12-30 HISTORY — DX: Herpesviral infection, unspecified: B00.9

## 2013-12-30 HISTORY — DX: Candidiasis, unspecified: B37.9

## 2013-12-30 LAB — OPIATES/OPIOIDS (LC/MS-MS)
Codeine Urine: NEGATIVE ng/mL
HYDROCODONE: 2580 ng/mL
Heroin (6-AM), UR: NEGATIVE ng/mL
Hydromorphone: 572 ng/mL
MORPHINE: NEGATIVE ng/mL
Noroxycodone, Ur: 1015 ng/mL
OXYMORPHONE, URINE: 130 ng/mL
Oxycodone, ur: NEGATIVE ng/mL

## 2013-12-30 LAB — CBC
HCT: 34.4 % — ABNORMAL LOW (ref 36.0–46.0)
Hemoglobin: 11.8 g/dL — ABNORMAL LOW (ref 12.0–15.0)
MCH: 31.6 pg (ref 26.0–34.0)
MCHC: 34.3 g/dL (ref 30.0–36.0)
MCV: 92 fL (ref 78.0–100.0)
Platelets: 233 10*3/uL (ref 150–400)
RBC: 3.74 MIL/uL — AB (ref 3.87–5.11)
RDW: 13 % (ref 11.5–15.5)
WBC: 14.6 10*3/uL — ABNORMAL HIGH (ref 4.0–10.5)

## 2013-12-30 LAB — RAPID URINE DRUG SCREEN, HOSP PERFORMED
AMPHETAMINES: NOT DETECTED
Barbiturates: NOT DETECTED
Benzodiazepines: NOT DETECTED
Cocaine: NOT DETECTED
Opiates: POSITIVE — AB
Tetrahydrocannabinol: NOT DETECTED

## 2013-12-30 LAB — RPR: RPR: NONREACTIVE

## 2013-12-30 LAB — ABO/RH: ABO/RH(D): O POS

## 2013-12-30 MED ORDER — TETANUS-DIPHTH-ACELL PERTUSSIS 5-2.5-18.5 LF-MCG/0.5 IM SUSP: 0.5000 mL | Freq: Once | INTRAMUSCULAR | Status: DC

## 2013-12-30 MED ORDER — ONDANSETRON HCL 4 MG/2ML IJ SOLN: 4.0000 mg | Freq: Four times a day (QID) | INTRAMUSCULAR | Status: DC | PRN

## 2013-12-30 MED ORDER — LACTATED RINGERS IV SOLN
INTRAVENOUS | Status: DC
  Administered 2013-12-30: 02:00:00 via INTRAVENOUS

## 2013-12-30 MED ORDER — DIPHENHYDRAMINE HCL 50 MG/ML IJ SOLN: 12.5000 mg | INTRAMUSCULAR | Status: DC | PRN

## 2013-12-30 MED ORDER — ONDANSETRON HCL 4 MG PO TABS: 4.0000 mg | ORAL_TABLET | ORAL | Status: DC | PRN

## 2013-12-30 MED ORDER — OXYCODONE-ACETAMINOPHEN 5-325 MG PO TABS
1.0000 | ORAL_TABLET | ORAL | Status: DC | PRN
  Administered 2013-12-30 – 2013-12-31 (×5): 1 via ORAL
  Administered 2014-01-01 (×3): 2 via ORAL
  Filled 2013-12-30 (×3): qty 1
  Filled 2013-12-30 (×3): qty 2
  Filled 2013-12-30 (×2): qty 1
  Filled 2013-12-30: qty 2

## 2013-12-30 MED ORDER — LANOLIN HYDROUS EX OINT: TOPICAL_OINTMENT | CUTANEOUS | Status: DC | PRN

## 2013-12-30 MED ORDER — PHENYLEPHRINE 40 MCG/ML (10ML) SYRINGE FOR IV PUSH (FOR BLOOD PRESSURE SUPPORT)
80.0000 ug | PREFILLED_SYRINGE | INTRAVENOUS | Status: DC | PRN
  Filled 2013-12-30: qty 2

## 2013-12-30 MED ORDER — FLEET ENEMA 7-19 GM/118ML RE ENEM: 1.0000 | ENEMA | RECTAL | Status: DC | PRN

## 2013-12-30 MED ORDER — FENTANYL 2.5 MCG/ML BUPIVACAINE 1/10 % EPIDURAL INFUSION (WH - ANES)
14.0000 mL/h | INTRAMUSCULAR | Status: DC | PRN
  Administered 2013-12-30: 14 mL/h via EPIDURAL
  Filled 2013-12-30: qty 125

## 2013-12-30 MED ORDER — SIMETHICONE 80 MG PO CHEW: 80.0000 mg | CHEWABLE_TABLET | ORAL | Status: DC | PRN

## 2013-12-30 MED ORDER — LACTATED RINGERS IV SOLN: INTRAVENOUS | Status: DC

## 2013-12-30 MED ORDER — LIDOCAINE HCL (PF) 1 % IJ SOLN
30.0000 mL | INTRAMUSCULAR | Status: DC | PRN
Start: 2013-12-30 — End: 2013-12-30
  Filled 2013-12-30 (×2): qty 30

## 2013-12-30 MED ORDER — WITCH HAZEL-GLYCERIN EX PADS: 1.0000 "application " | MEDICATED_PAD | CUTANEOUS | Status: DC | PRN

## 2013-12-30 MED ORDER — LACTATED RINGERS IV SOLN
500.0000 mL | Freq: Once | INTRAVENOUS | Status: AC
  Administered 2013-12-30: 500 mL via INTRAVENOUS

## 2013-12-30 MED ORDER — LACTATED RINGERS IV SOLN
500.0000 mL | INTRAVENOUS | Status: DC | PRN
  Administered 2013-12-30: 500 mL via INTRAVENOUS

## 2013-12-30 MED ORDER — ZOLPIDEM TARTRATE 5 MG PO TABS: 5.0000 mg | ORAL_TABLET | Freq: Every evening | ORAL | Status: DC | PRN

## 2013-12-30 MED ORDER — OXYTOCIN BOLUS FROM INFUSION
500.0000 mL | INTRAVENOUS | Status: DC
  Administered 2013-12-30: 500 mL via INTRAVENOUS

## 2013-12-30 MED ORDER — EPHEDRINE 5 MG/ML INJ
10.0000 mg | INTRAVENOUS | Status: DC | PRN
  Filled 2013-12-30: qty 2

## 2013-12-30 MED ORDER — ONDANSETRON HCL 4 MG/2ML IJ SOLN: 4.0000 mg | INTRAMUSCULAR | Status: DC | PRN

## 2013-12-30 MED ORDER — ACETAMINOPHEN 325 MG PO TABS: 650.0000 mg | ORAL_TABLET | ORAL | Status: DC | PRN

## 2013-12-30 MED ORDER — CITRIC ACID-SODIUM CITRATE 334-500 MG/5ML PO SOLN: 30.0000 mL | ORAL | Status: DC | PRN

## 2013-12-30 MED ORDER — SENNOSIDES-DOCUSATE SODIUM 8.6-50 MG PO TABS
2.0000 | ORAL_TABLET | ORAL | Status: DC
  Administered 2013-12-31 – 2014-01-01 (×2): 2 via ORAL
  Filled 2013-12-30 (×2): qty 2

## 2013-12-30 MED ORDER — DIBUCAINE 1 % RE OINT: 1.0000 "application " | TOPICAL_OINTMENT | RECTAL | Status: DC | PRN

## 2013-12-30 MED ORDER — EPHEDRINE 5 MG/ML INJ
10.0000 mg | INTRAVENOUS | Status: DC | PRN
  Filled 2013-12-30: qty 4
  Filled 2013-12-30: qty 2

## 2013-12-30 MED ORDER — DIPHENHYDRAMINE HCL 25 MG PO CAPS: 25.0000 mg | ORAL_CAPSULE | Freq: Four times a day (QID) | ORAL | Status: DC | PRN

## 2013-12-30 MED ORDER — BENZOCAINE-MENTHOL 20-0.5 % EX AERO
1.0000 "application " | INHALATION_SPRAY | CUTANEOUS | Status: DC | PRN
  Filled 2013-12-30: qty 56

## 2013-12-30 MED ORDER — IBUPROFEN 600 MG PO TABS
600.0000 mg | ORAL_TABLET | Freq: Four times a day (QID) | ORAL | Status: DC
  Administered 2013-12-30 – 2014-01-01 (×9): 600 mg via ORAL
  Filled 2013-12-30 (×9): qty 1

## 2013-12-30 MED ORDER — OXYTOCIN 40 UNITS IN LACTATED RINGERS INFUSION - SIMPLE MED
62.5000 mL/h | INTRAVENOUS | Status: DC
  Filled 2013-12-30: qty 1000

## 2013-12-30 MED ORDER — PRENATAL MULTIVITAMIN CH
1.0000 | ORAL_TABLET | Freq: Every day | ORAL | Status: DC
  Administered 2013-12-30 – 2014-01-01 (×3): 1 via ORAL
  Filled 2013-12-30 (×3): qty 1

## 2013-12-30 MED ORDER — LIDOCAINE HCL (PF) 1 % IJ SOLN
INTRAMUSCULAR | Status: DC | PRN
  Administered 2013-12-30 (×2): 5 mL

## 2013-12-30 MED ORDER — PHENYLEPHRINE 40 MCG/ML (10ML) SYRINGE FOR IV PUSH (FOR BLOOD PRESSURE SUPPORT)
80.0000 ug | PREFILLED_SYRINGE | INTRAVENOUS | Status: DC | PRN
  Filled 2013-12-30: qty 2
  Filled 2013-12-30: qty 10

## 2013-12-30 MED ORDER — ESCITALOPRAM OXALATE 10 MG PO TABS
10.0000 mg | ORAL_TABLET | Freq: Every day | ORAL | Status: DC
  Administered 2013-12-30 – 2014-01-01 (×3): 10 mg via ORAL
  Filled 2013-12-30 (×3): qty 1

## 2013-12-30 MED ORDER — IBUPROFEN 600 MG PO TABS
600.0000 mg | ORAL_TABLET | Freq: Four times a day (QID) | ORAL | Status: DC | PRN
  Administered 2013-12-30: 600 mg via ORAL
  Filled 2013-12-30: qty 1

## 2013-12-30 NOTE — Anesthesia Preprocedure Evaluation (Signed)
Anesthesia Evaluation  Patient identified by MRN, date of birth, ID band Patient awake    Reviewed: Allergy & Precautions, H&P , Patient's Chart, lab work & pertinent test results  Airway Mallampati: II TM Distance: >3 FB Neck ROM: full    Dental   Pulmonary Current Smoker,  breath sounds clear to auscultation        Cardiovascular Rhythm:regular Rate:Normal     Neuro/Psych    GI/Hepatic   Endo/Other    Renal/GU      Musculoskeletal   Abdominal   Peds  Hematology   Anesthesia Other Findings   Reproductive/Obstetrics (+) Pregnancy                           Anesthesia Physical Anesthesia Plan  ASA: II  Anesthesia Plan: Epidural   Post-op Pain Management:    Induction:   Airway Management Planned:   Additional Equipment:   Intra-op Plan:   Post-operative Plan:   Informed Consent: I have reviewed the patients History and Physical, chart, labs and discussed the procedure including the risks, benefits and alternatives for the proposed anesthesia with the patient or authorized representative who has indicated his/her understanding and acceptance.     Plan Discussed with:   Anesthesia Plan Comments:         Anesthesia Quick Evaluation  

## 2013-12-30 NOTE — H&P (Signed)
TANAJA GANGER is a 23 y.o. G1P0 female [redacted]w[redacted]d presenting to MAU c/o contractions.  Started around 9:30 p.m. and have gradually increased in frequency and intensity.  Positive fetal movement.  Reports small amount of vaginal bleeding tonight in MAU - described as clot with some bright-red blood around it.  Denies LOF.  Pregnancy complicated by posterior placenta previa - has migrated  To low lying since original scan.  Repeat US scheduled for today but was not performed.  Limited PNC at [facility].  History OB History   Grav Para Term Preterm Abortions TAB SAB Ect Mult Living   1              Past Medical History  Diagnosis Date  . Pregnant   . Kidney stone   . Shingles 08/22/2013    Under chin  . Placenta previa 08/28/2013   History reviewed. No pertinent past surgical history. Family History: family history includes COPD in her maternal grandfather and maternal grandmother; Cancer in her paternal grandfather; Diabetes in her father, maternal grandmother, and paternal grandfather. Social History:  reports that she has been smoking Cigarettes.  She has been smoking about 0.00 packs per day. She has never used smokeless tobacco. She reports that she does not drink alcohol or use illicit drugs.   Prenatal Transfer Tool  Maternal Diabetes: No Genetic Screening: Declined Maternal Ultrasounds/Referrals:low lying placenta  Fetal Ultrasounds or other Referrals:  None Maternal Substance Abuse:  No Significant Maternal Medications:  None Significant Maternal Lab Results:  None Other Comments:  None  Review of Systems  Gastrointestinal:       Positive for painful contractions.  All other systems reviewed and are negative.    Dilation: 5 Effacement (%): 100 Station: -2 Blood pressure 124/81, pulse 75, temperature 98.1 F (36.7 C), temperature source Oral, resp. rate 18, height 5\' 6"  (1.676 m), weight 63.957 kg (141 lb), last menstrual period 04/07/2013. Maternal Exam:  Uterine  Assessment: Contraction strength is firm.  Contraction duration is 1 minute. Contraction frequency is irregular.   Abdomen: Patient reports generalized tenderness.  Fetal presentation: vertex  Introitus: Normal vulva. Normal vagina.  Vagina is negative for discharge.  Ferning test: not done.  Nitrazine test: not done. Amniotic fluid character: not assessed.  Pelvis: adequate for delivery.   Cervix: Cervix evaluated by digital exam.     Fetal Exam Fetal Monitor Review: Mode: ultrasound.   Baseline rate: 130.  Variability: moderate (6-25 bpm).   Pattern: accelerations present, early decelerations and variable decelerations.    Fetal State Assessment: Category II - tracings are indeterminate.     Physical Exam  Nursing note and vitals reviewed. Constitutional: She is oriented to person, place, and time. She appears well-developed and well-nourished.  HENT:  Head: Normocephalic and atraumatic.  Cardiovascular: Normal rate.   Respiratory: Effort normal. No respiratory distress.  GI: There is generalized tenderness.  Gravid.  Genitourinary: Vagina normal and uterus normal. No vaginal discharge found.  No visible lesions.    Neurological: She is alert and oriented to person, place, and time.  Skin: Skin is warm.  Psychiatric: She has a normal mood and affect. Her behavior is normal.    Prenatal labs: ABO, Rh: O/POS/-- (07/11 1300) Antibody: NEG (01/15 1540) Rubella: 3.66 (07/11 1300) RPR: NON REAC (01/15 1540)  HBsAg: NEGATIVE (07/11 1300)  HIV: NON REACTIVE (01/15 1540)  GBS: NEGATIVE (01/15 1451)   Assessment/Plan: #22 y.o. G1P0 at [redacted]w[redacted]d #Active labor - IV insertion, admit [facility] L&[facility] #  [facility] #Posterior Placenta Previa - measuring 1.8 cm from os at last scan at 25 weeks; missed last US #Anxiety - treated with Lexapro; UDS + for benzos and opiates on 12/26/13; UDS pending #Fetal Wellbeing - Category I #Pain Control - desires epidural (placed) #I&D - history of HSV;  untreated; no lesions appreciated on speculum exam    TUCKER, BRITTON L 12/30/2013, 1:44 AM   I have seen and examined this patient and agree with above documentation in the PA student's note. 23 yo G1 @ 823w6d here in active labor.  Pt with a hx of posterior previa that on 25 wk scan had migrated to 1.8cm above the os. Attempted US bedside but given advanced cervical dilatation, unable to ascertain distance from OS. Scant bleeding like bloody show.  Will cont to monitor closely as not currently contraindicated for vaginal delivery. Hx of limited PNC and UDS + for benzos and opiates. SW needed post delivery.  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 EFW 6.5lbs.   Anticipates SVD  Rulon AbideKeli Gethsemane Fischler, M.D. [facility] 12/30/2013 3:35 AM

## 2013-12-30 NOTE — Anesthesia Procedure Notes (Signed)
Epidural Patient location during procedure: OB Start time: 12/30/2013 2:20 AM  Staffing Anesthesiologist: Brayton CavesJACKSON, Lilla Callejo Performed by: anesthesiologist   Preanesthetic Checklist Completed: patient identified, site marked, surgical consent, pre-op evaluation, timeout performed, IV checked, risks and benefits discussed and monitors and equipment checked  Epidural Patient position: sitting Prep: site prepped and draped and DuraPrep Patient monitoring: continuous pulse ox and blood pressure Approach: midline Injection technique: LOR air  Needle:  Needle type: Tuohy  Needle gauge: 17 G Needle length: 9 cm and 9 Needle insertion depth: 5 cm cm Catheter type: closed end flexible Catheter size: 19 Gauge Catheter at skin depth: 10 cm Test dose: negative  Assessment Events: blood not aspirated, injection not painful, no injection resistance, negative IV test and no paresthesia  Additional Notes Patient identified.  Risk benefits discussed including failed block, incomplete pain control, headache, nerve damage, paralysis, blood pressure changes, nausea, vomiting, reactions to medication both toxic or allergic, and postpartum back pain.  Patient expressed understanding and wished to proceed.  All questions were answered.  Sterile technique used throughout procedure and epidural site dressed with sterile barrier dressing. No paresthesia or other complications noted.The patient did not experience any signs of intravascular injection such as tinnitus or metallic taste in mouth nor signs of intrathecal spread such as rapid motor block. Please see nursing notes for vital signs.

## 2013-12-30 NOTE — Anesthesia Postprocedure Evaluation (Signed)
  Anesthesia Post-op Note  Anesthesia Post Note  Patient: Christine FoyerJacqueline M Babington  Procedure(s) Performed: * No procedures listed *  Anesthesia type: Epidural  Patient location: Mother/Baby  Post pain: Pain level controlled  Post assessment: Post-op Vital signs reviewed  Last Vitals:  Filed Vitals:   12/30/13 1212  BP: 111/64  Pulse: 75  Temp: 36.6 C  Resp: 18    Post vital signs: Reviewed  Level of consciousness:alert  Complications: No apparent anesthesia complications

## 2013-12-31 LAB — CBC
HCT: 31.9 % — ABNORMAL LOW (ref 36.0–46.0)
HEMOGLOBIN: 10.8 g/dL — AB (ref 12.0–15.0)
MCH: 31.9 pg (ref 26.0–34.0)
MCHC: 33.9 g/dL (ref 30.0–36.0)
MCV: 94.1 fL (ref 78.0–100.0)
Platelets: 247 10*3/uL (ref 150–400)
RBC: 3.39 MIL/uL — ABNORMAL LOW (ref 3.87–5.11)
RDW: 13.3 % (ref 11.5–15.5)
WBC: 18.3 10*3/uL — AB (ref 4.0–10.5)

## 2013-12-31 NOTE — Clinical Social Work Maternal (Signed)
Clinical Social Work Department PSYCHOSOCIAL ASSESSMENT - MATERNAL/CHILD 12/31/2013  Patient:  Christine Day, Christine Day  Account Number:  [id number]  Admit Date:  12/30/2013  Ardine Eng Name:   Claudie Fisherman    Clinical Social Worker:  Gerri Spore, LCSW   Date/Time:  12/31/2013 02:12 PM  Date Referred:  12/31/2013   Referral source  CN     Referred reason  Substance Abuse  Other - See comment   Other referral source:    I:  FAMILY / [name] legal guardian:  PARENT  Guardian - Name Lillie - Age Guardian - Address  [name] [address] [place], [place] [address]  Chipper Herb. Sharlett Iles 22 (incarcerated)   Other household support members/support persons Name Relationship DOB  Rudene Christians MOTHER   Sophronia Simas STEPFATHER    BROTHER 69 years old   Other support:    II  PSYCHOSOCIAL DATA Information Source:  Patient Christine Day and Community Resources Employment:   [employer]   Financial resources:  Multimedia programmer If [place]:    School / Grade:   Maternity Care Coordinator / Child Services Coordination / Early Interventions:  Cultural issues impacting care:    III  STRENGTHS Strengths  Adequate Resources  Home prepared for Child (including basic supplies)  Supportive family/friends   Strength comment:    IV  RISK FACTORS AND CURRENT PROBLEMS Current Problem:  YES   Risk Factor & Current Problem Patient Issue Family Issue Risk Factor / Current Problem Comment  Other - See comment Y N Lapse in [facility]   N N     V  SOCIAL WORK ASSESSMENT CSW met with pt to assess reason for lapse in [facility] & gain clarity about pt's prescription substance use.  Pt is a 23 year old, G1P1 who lives at home with her mother & stepfather.  She works 3rd shift at [employer].  She acknowledges some depression/anxiety in the past & during pregnancy.  She identified the source of her most recent  depression as, FOB's incarceration.  FOB was incarcerated in Nov. '14 for "Felony Breaking & Entering." He has not gone to trail & she does not expect him to be released any time this year.  Presently, she states she is coping with his absence well & has talked to him to inform him about the birth.  During pregnancy, pt explained that she was in a depressive state & "feeling blah," for a couple of weeks.  She isolated, loss interest in everything & slept majority of the time.  Therefore, pt missed several doctor appointments.  Pt said " I only got out of bed when necessary."   She denies any SI/HI during that time.  She sought mental health treatment & was prescribed Lexapro towards the end of last year.  She took one months supply of medication.  She did not follow through with counseling recommendations & does not express any interest in counseling options at this time.  CSW inquired about pt's prescription substance use.  Pt told CSW that she was prescribed Tylenol 3 a dentist at [facility] in Dec. '14 after experiencing wisdom tooth pain.  Pt recently had that prescription refilled one day last week.  She reports that she took 4 pills per day.  Pt was also prescribed Percocet 5 mg by physicians at [facility] towards the end of last year (Nov.'14).  She explained that she has kidney stones,  which causes her a lot of pain & experienced a kidney infection one time.  Pt estimates that she took 3-4 pills per day throughout the remainder of pregnancy.  She does not have an explanation for benzo' s, as she denies any use of Xanax since teenage years.  CSW informed the pt that the infant may experience withdrawal symptoms due to the narcotic use & require a higher level of care.  Pt verbalized understanding & did not appear concerned.  CSW explained hospital drug testing policy & pt denied any illegal substance use.  UDS is positive for opiates, meconium results are pending.  Pt appears to be bonding well  with the infant & appropriate at this time. She has all the necessary supplies for the infant & adequate support.  CSW encouraged pt to seek mental health treatment if PP depression or anxiety symptoms arise.  CSW will continue to monitor drug screen results & make a referral if needed.      VI SOCIAL WORK PLAN Social Work Plan  No Further Intervention Required / No Barriers to Discharge   Type of pt/family education:   If child protective services report - county:   If child protective services report - date:   Information/referral to community resources comment:   Other social work plan:

## 2013-12-31 NOTE — Discharge Summary (Signed)
  Obstetric Discharge Summary Reason for Admission: onset of labor Prenatal Procedures: ultrasound Intrapartum Procedures: spontaneous vaginal delivery Postpartum Procedures: none Complications-Operative and Postpartum: none Hemoglobin  Date Value Range Status  12/31/2013 10.8* 12.0 - 15.0 g/dL Final     HCT  Date Value Range Status  12/31/2013 31.9* 36.0 - 46.0 % Final   Pt reports some left leg weakness that onset yesterday.  Most likely due to deconditioned state.  Physical Exam:  Filed Vitals:   01/01/14 0607  BP: 126/75  Pulse: 80  Temp: 97.4 F (36.3 C)  Resp: 18    General: alert, cooperative and appears stated age 23Lochia: appropriate Uterine Fundus: firm Incision: n/a DVT Evaluation: No evidence of DVT seen on physical exam.  Discharge Diagnoses: Term Pregnancy-delivered  Discharge Information: Date: 01/01/2014 Activity: pelvic rest Diet: routine Medications: PNV and Ibuprofen Condition: stable Instructions: refer to practice specific booklet Discharge to: home   Newborn Data: Live born female  Birth Weight: 6 lb 6.3 oz (2900 g) APGAR: 9, 9  Home with mother. No social barriers to discharge  Brynda GreathouseJacqueline Hylton is a 23 yo who presented for contractions and moved to a NSVD at 37.5 wks with an epidural.  There was 300 cc of BL with no lacerations.  Apgar 9 and 9. Mom is bottle feeding and using POP for contraception.  Toilolo, Tifi 12/31/2013, 7:45 AM  I examined pt and agree with documentation above and PA-S plan of care. [id number],Vidit Boissonneault

## 2013-12-31 NOTE — Progress Notes (Signed)
Post Partum Day 1 Subjective: no complaints, up ad lib, voiding, tolerating PO and + flatus  Objective: Blood pressure 110/70, pulse 77, temperature 97.5 F (36.4 C), temperature source Oral, resp. rate 18, height 5\' 6"  (1.676 m), weight 63.957 kg (141 lb), last menstrual period 04/07/2013, SpO2 99.00%, unknown if currently breastfeeding.  Physical Exam:  General: alert, cooperative, appears stated age and no distress Lochia: appropriate Uterine Fundus: firm DVT Evaluation: No evidence of DVT seen on physical exam. Negative Homan's sign. No cords or calf tenderness. No significant calf/ankle edema.   Recent Labs  12/30/13 0140 12/31/13 0618  HGB 11.8* 10.8*  HCT 34.4* 31.9*    Assessment/Plan: Social Work consult and Contraception possible discharge today pending SW, MOC POP vs mirena   LOS: 1 day   Meklit Cotta RYAN 12/31/2013, 11:54 AM

## 2014-01-01 MED ORDER — NORETHINDRONE 0.35 MG PO TABS
1.0000 | ORAL_TABLET | Freq: Every day | ORAL | Status: DC
Start: 2014-01-01 — End: 2014-02-05

## 2014-01-01 MED ORDER — IBUPROFEN 600 MG PO TABS: 600.0000 mg | ORAL_TABLET | Freq: Four times a day (QID) | ORAL | Status: DC

## 2014-01-01 MED ORDER — IBUPROFEN 200 MG PO TABS: 200.0000 mg | ORAL_TABLET | Freq: Four times a day (QID) | ORAL | Status: DC | PRN

## 2014-01-01 MED ORDER — ACETAMINOPHEN-CODEINE #3 300-30 MG PO TABS: 1.0000 | ORAL_TABLET | ORAL | Status: DC | PRN

## 2014-01-01 NOTE — Discharge Summary (Signed)
Attestation of Attending Supervision of Advanced Practitioner (CNM/NP): Evaluation and management procedures were performed by the Advanced Practitioner under my supervision and collaboration.  I have reviewed the Advanced Practitioner's note and chart, and I agree with the management and plan.  Juliann Olesky 01/01/2014 9:24 AM

## 2014-01-01 NOTE — Discharge Instructions (Signed)

## 2014-02-03 ENCOUNTER — Telehealth: Payer: Self-pay | Admitting: Obstetrics and Gynecology

## 2014-02-03 NOTE — Telephone Encounter (Signed)
Pt wants to return to work asap, has not had postpartum appt, scheduled postpartum appt this week and will discuss note with provider at that time.

## 2014-02-04 ENCOUNTER — Encounter (HOSPITAL_COMMUNITY): Payer: Self-pay | Admitting: Emergency Medicine

## 2014-02-04 ENCOUNTER — Ambulatory Visit: Payer: BC Managed Care – PPO | Admitting: Women's Health

## 2014-02-04 ENCOUNTER — Emergency Department (HOSPITAL_COMMUNITY): Payer: BC Managed Care – PPO

## 2014-02-04 ENCOUNTER — Emergency Department (HOSPITAL_COMMUNITY)
Admission: EM | Admit: 2014-02-04 | Discharge: 2014-02-04 | Disposition: A | Discharge status: Home / Self Care | Payer: BC Managed Care – PPO | Attending: Emergency Medicine | Admitting: Emergency Medicine

## 2014-02-04 DIAGNOSIS — M5430 Sciatica, unspecified side: Secondary | ICD-10-CM | POA: Insufficient documentation

## 2014-02-04 DIAGNOSIS — O909 Complication of the puerperium, unspecified: Principal | ICD-10-CM

## 2014-02-04 DIAGNOSIS — Y939 Activity, unspecified: Secondary | ICD-10-CM | POA: Insufficient documentation

## 2014-02-04 DIAGNOSIS — Z79899 Other long term (current) drug therapy: Secondary | ICD-10-CM | POA: Insufficient documentation

## 2014-02-04 DIAGNOSIS — O26899 Other specified pregnancy related conditions, unspecified trimester: Secondary | ICD-10-CM | POA: Insufficient documentation

## 2014-02-04 DIAGNOSIS — Z87442 Personal history of urinary calculi: Secondary | ICD-10-CM | POA: Insufficient documentation

## 2014-02-04 DIAGNOSIS — Z8619 Personal history of other infectious and parasitic diseases: Secondary | ICD-10-CM | POA: Insufficient documentation

## 2014-02-04 DIAGNOSIS — W19XXXA Unspecified fall, initial encounter: Secondary | ICD-10-CM | POA: Insufficient documentation

## 2014-02-04 DIAGNOSIS — O99335 Smoking (tobacco) complicating the puerperium: Secondary | ICD-10-CM | POA: Insufficient documentation

## 2014-02-04 DIAGNOSIS — Y929 Unspecified place or not applicable: Secondary | ICD-10-CM | POA: Insufficient documentation

## 2014-02-04 DIAGNOSIS — IMO0002 Reserved for concepts with insufficient information to code with codable children: Secondary | ICD-10-CM | POA: Insufficient documentation

## 2014-02-04 LAB — URINALYSIS, ROUTINE W REFLEX MICROSCOPIC
Bilirubin Urine: NEGATIVE
Glucose, UA: NEGATIVE mg/dL
Ketones, ur: NEGATIVE mg/dL
Leukocytes, UA: NEGATIVE
NITRITE: NEGATIVE
PH: 7 (ref 5.0–8.0)
Protein, ur: NEGATIVE mg/dL
Specific Gravity, Urine: 1.02 (ref 1.005–1.030)
UROBILINOGEN UA: 0.2 mg/dL (ref 0.0–1.0)

## 2014-02-04 LAB — URINE MICROSCOPIC-ADD ON

## 2014-02-04 MED ORDER — CYCLOBENZAPRINE HCL 10 MG PO TABS: 10.0000 mg | ORAL_TABLET | Freq: Two times a day (BID) | ORAL | Status: DC | PRN

## 2014-02-04 NOTE — ED Notes (Signed)
Pt with left lower back since epidural done on Jan. 19th but was tolerating pain with tylenol, states she fell and landed on right side the other day and pain on left side has increased significantly and radiates down left leg, constant pain to left leg

## 2014-02-04 NOTE — ED Provider Notes (Signed)
CSN: [id number]     Arrival date & time 02/04/14  1419 History   First MD Initiated Contact with Patient 02/04/14 1443     Chief Complaint  Patient presents with  . Back Pain     (Consider location/radiation/quality/duration/timing/severity/associated sxs/prior Treatment) Patient is a 23 y.o. female presenting with back pain. The history is provided by the patient.  Back Pain Location:  Lumbar spine Quality:  Aching and shooting Radiates to:  L knee Pain severity:  Moderate Pain is:  Same all the time Progression:  Worsening Relieved by:  Nothing Worsened by:  Standing and ambulation Associated symptoms: no abdominal pain, no chest pain, no dysuria, no fever and no headaches    Christine Day is a 23 y.o. female who presents to the ED with back pain that she states has been ongoing since she had her epidural 12/30/2013 when she had her baby. She had Tylenol #3 that she took and was ok. She states that they told her she had inflammation in the nerve and it should resolve. Over the past week the pain was getting worse and then she states that she fell 2 days ago an now the pain is severe. She states she fell on her right buttock but the right side is what hurts. She had a call to Dr. Rayna Sexton office yesterday requesting that her post partum visit be moved up so she could go back to work as soon as possible. No mention was made of back pain or any problems. She states she was going to have her check up today and tell them about it .   Past Medical History  Diagnosis Date  . Pregnant   . Shingles 08/22/2013    Under chin  . Placenta previa 08/28/2013  . Yeast infection   . Herpes   . Kidney stone    Past Surgical History  Procedure Laterality Date  . No past surgeries     Family History  Problem Relation Age of Onset  . Diabetes Father   . Diabetes Maternal Grandmother   . COPD Maternal Grandmother   . COPD Maternal Grandfather   . Diabetes Paternal Grandfather   . Cancer  Paternal Grandfather     lung cancer   History  Substance Use Topics  . Smoking status: Current Every Day Smoker    Types: Cigarettes  . Smokeless tobacco: Never Used  . Alcohol Use: No   OB History   Grav Para Term Preterm Abortions TAB SAB Ect Mult Living   1 1 1       1      Review of Systems  Constitutional: Negative for fever.  HENT: Negative.   Eyes: Negative for visual disturbance.  Respiratory: Negative for shortness of breath and wheezing.   Cardiovascular: Negative for chest pain.  Gastrointestinal: Negative for nausea, vomiting and abdominal pain.  Genitourinary: Positive for vaginal bleeding. Negative for dysuria and frequency.  Musculoskeletal: Positive for back pain.  Skin: Negative for rash.  Neurological: Negative for light-headedness and headaches.  Psychiatric/Behavioral: Negative for confusion. Nervous/anxious: hx of.       Allergies  Hydrocodone and Morphine and related  Home Medications   Current Outpatient Rx  Name  Route  Sig  Dispense  Refill  . acetaminophen (TYLENOL) 325 MG tablet   Oral   Take 650 mg by mouth every 6 (six) hours as needed for moderate pain.         Marland Kitchen oxyCODONE-acetaminophen (PERCOCET/ROXICET) 5-325 MG per tablet  Oral   Take 1 tablet by mouth every 6 (six) hours as needed for moderate pain or severe pain.         Marland Kitchen norethindrone (MICRONOR,CAMILA,ERRIN) 0.35 MG tablet   Oral   Take 1 tablet (0.35 mg total) by mouth daily.   1 Package   1    BP 107/67  Pulse 85  Temp(Src) 98.2 F (36.8 C) (Oral)  Resp 18  Ht 5\' 6"  (1.676 m)  Wt 121 lb 7 oz (55.084 kg)  BMI 19.61 kg/m2  SpO2 100%  Breastfeeding? No Physical Exam  Nursing note and vitals reviewed. Constitutional: She is oriented to person, place, and time. She appears well-developed and well-nourished. No distress.  HENT:  Head: Normocephalic and atraumatic.  Eyes: EOM are normal.  Neck: Neck supple.  Cardiovascular: Normal rate and regular rhythm.     Pulmonary/Chest: Effort normal.  Abdominal: Soft. There is no tenderness.  Musculoskeletal: Normal range of motion.       Lumbar back: She exhibits spasm. She exhibits normal range of motion and normal pulse.       Back:  Neurological: She is alert and oriented to person, place, and time. She has normal strength. No cranial nerve deficit or sensory deficit. Gait normal.  Reflex Scores:      Bicep reflexes are 2+ on the right side and 2+ on the left side.      Brachioradialis reflexes are 2+ on the right side and 2+ on the left side.      Patellar reflexes are 2+ on the right side and 2+ on the left side.      Achilles reflexes are 2+ on the right side and 2+ on the left side. Skin: Skin is warm and dry.  Psychiatric: She has a normal mood and affect. Her behavior is normal.   Results for orders placed during the hospital encounter of 02/04/14 (from the past 24 hour(s))  URINALYSIS, ROUTINE W REFLEX MICROSCOPIC     Status: Abnormal   Collection Time    02/04/14  3:20 PM      Result Value Ref Range   Color, Urine YELLOW  YELLOW   APPearance CLEAR  CLEAR   Specific Gravity, Urine 1.020  1.005 - 1.030   pH 7.0  5.0 - 8.0   Glucose, UA NEGATIVE  NEGATIVE mg/dL   Hgb urine dipstick MODERATE (*) NEGATIVE   Bilirubin Urine NEGATIVE  NEGATIVE   Ketones, ur NEGATIVE  NEGATIVE mg/dL   Protein, ur NEGATIVE  NEGATIVE mg/dL   Urobilinogen, UA 0.2  0.0 - 1.0 mg/dL   Nitrite NEGATIVE  NEGATIVE   Leukocytes, UA NEGATIVE  NEGATIVE  URINE MICROSCOPIC-ADD ON     Status: Abnormal   Collection Time    02/04/14  3:20 PM      Result Value Ref Range   Squamous Epithelial / LPF MANY (*) RARE   WBC, UA 3-6  <3 WBC/hpf   RBC / HPF 3-6  <3 RBC/hpf   Bacteria, UA RARE  RARE   Urine-Other MUCOUS PRESENT     NOTE: Patient having vaginal bleeding. Dg Lumbar Spine Complete  02/04/2014   CLINICAL DATA:  Low back pain  EXAM: LUMBAR SPINE - COMPLETE 4+ VIEW  COMPARISON:  09/01/2009  FINDINGS: There is no  evidence of lumbar spine fracture. Alignment is normal. Intervertebral disc spaces are maintained.  IMPRESSION: No acute finding.   Electronically Signed   By: Ruel Favors M.D.   On: 02/04/2014 15:39  ED Course  Procedures  MDM  23 y.o. female with left side back pain that radiates to the left buttock since epidural during labor and increased after a recent fall.  Will treat with muscle relaxant and she will follow up with Dr. Emelda FearFerguson as planned. She will take ibuprofen in addition to the muscle relaxant. I have reviewed this patient's vital signs, nurses notes, appropriate labs and imaging.  I have discussed findings and plan of care with the patient and she voices understanding.    Medication List    TAKE these medications       cyclobenzaprine 10 MG tablet  Commonly known as:  FLEXERIL  Take 1 tablet (10 mg total) by mouth 2 (two) times daily as needed for muscle spasms.      ASK your doctor about these medications       acetaminophen 325 MG tablet  Commonly known as:  TYLENOL  Take 650 mg by mouth every 6 (six) hours as needed for moderate pain.     norethindrone 0.35 MG tablet  Commonly known as:  MICRONOR,CAMILA,ERRIN  Take 1 tablet (0.35 mg total) by mouth daily.     oxyCODONE-acetaminophen 5-325 MG per tablet  Commonly known as:  PERCOCET/ROXICET  Take 1 tablet by mouth every 6 (six) hours as needed for moderate pain or severe pain.         [facility] Orlene OchM Caelen Higinbotham, [place]NP 02/04/14 [phone]

## 2014-02-04 NOTE — ED Notes (Signed)
Pain lt side of low back  Since had epidural,  1/19  , Fell two days ago and pain worse.  Pain Lt low back, lt hip and down to lt knee

## 2014-02-04 NOTE — Discharge Instructions (Signed)
Follow up with Dr. Emelda FearFerguson for your back problems. Take the medication as directed. Do not take it if you are driving as it will make you sleepy.

## 2014-02-05 ENCOUNTER — Ambulatory Visit (INDEPENDENT_AMBULATORY_CARE_PROVIDER_SITE_OTHER): Payer: BC Managed Care – PPO | Admitting: Advanced Practice Midwife

## 2014-02-05 ENCOUNTER — Encounter: Payer: Self-pay | Admitting: Advanced Practice Midwife

## 2014-02-05 MED ORDER — METAXALONE 800 MG PO TABS: 800.0000 mg | ORAL_TABLET | Freq: Three times a day (TID) | ORAL | Status: DC

## 2014-02-05 MED ORDER — ESCITALOPRAM OXALATE 10 MG PO TABS: 10.0000 mg | ORAL_TABLET | Freq: Every day | ORAL | Status: DC

## 2014-02-05 MED ORDER — LEVONORGEST-ETH ESTRAD 91-DAY 0.15-0.03 &0.01 MG PO TABS: 1.0000 | ORAL_TABLET | Freq: Every day | ORAL | Status: DC

## 2014-02-05 NOTE — Progress Notes (Signed)
  Christine Day is a 23 y.o. who presents for a postpartum visit. She is 5 weeks postpartum following a spontaneous vaginal delivery. I have fully reviewed the prenatal and intrapartum course. The delivery was at 38.6 gestational weeks.  Anesthesia: epidural. Postpartum course has been complicaned by postpartum depression. Baby's course has been uneventful. Baby is feeding by bottle. Bleeding: no bleeding. Bowel function is normal. Bladder function is normal. Patient is not sexually active. Contraception method is none. Postpartum depression screening: positive. Discussed at length recommendations for dealing with depression/anxiety, specifically counseling and/or medication. Feels like it may be difficult to open up to a stranger. Agrees to go to at least 2 counseling appts.   Denies HI/SI.  Review of Systems   Constitutional: Negative for fever and chills Eyes: Negative for visual disturbances Respiratory: Negative for shortness of breath, dyspnea Cardiovascular: Negative for chest pain or palpitations  Gastrointestinal: Negative for vomiting, diarrhea and constipation Genitourinary: Negative for dysuria and urgency Musculoskeletal: Negative for back pain, joint pain, myalgias  Neurological: Negative for dizziness and headaches   Objective:     Filed Vitals:   02/05/14 1350  BP: 104/62   General:  alert, cooperative and no distress   Breasts:  negative  Lungs: clear to auscultation bilaterally  Heart:  regular rate and rhythm  Abdomen: Soft, nontender   Vulva:  normal  Vagina: normal vagina  Cervix:  closed  Corpus: Well involuted     Rectal Exam: no hemorrhoids        Assessment:    normal postpartum exam. Postpartum depression Plan:    1. Contraception: seasonale:  b/u for 3 weeks.  start today 2. Referral to [facility] made 3.  Lexapro 10mg  daily

## 2014-02-06 NOTE — ED Provider Notes (Signed)
Medical screening examination/treatment/procedure(s) were performed by non-physician practitioner and as supervising physician I was immediately available for consultation/collaboration.  EKG Interpretation   None        Donnetta HutchingBrian Marcelles Clinard, MD 02/06/14 1659

## 2014-09-04 ENCOUNTER — Emergency Department (HOSPITAL_COMMUNITY)
Admission: EM | Admit: 2014-09-04 | Discharge: 2014-09-04 | Disposition: A | Discharge status: Home / Self Care | Payer: BC Managed Care – PPO | Attending: Emergency Medicine | Admitting: Emergency Medicine

## 2014-09-04 ENCOUNTER — Encounter (HOSPITAL_COMMUNITY): Payer: Self-pay | Admitting: Emergency Medicine

## 2014-09-04 DIAGNOSIS — Z87442 Personal history of urinary calculi: Secondary | ICD-10-CM | POA: Insufficient documentation

## 2014-09-04 DIAGNOSIS — M542 Cervicalgia: Secondary | ICD-10-CM | POA: Insufficient documentation

## 2014-09-04 DIAGNOSIS — J029 Acute pharyngitis, unspecified: Secondary | ICD-10-CM | POA: Insufficient documentation

## 2014-09-04 DIAGNOSIS — F17200 Nicotine dependence, unspecified, uncomplicated: Secondary | ICD-10-CM | POA: Insufficient documentation

## 2014-09-04 DIAGNOSIS — Z79899 Other long term (current) drug therapy: Secondary | ICD-10-CM | POA: Insufficient documentation

## 2014-09-04 DIAGNOSIS — Z8619 Personal history of other infectious and parasitic diseases: Secondary | ICD-10-CM | POA: Insufficient documentation

## 2014-09-04 MED ORDER — OXYCODONE HCL 5 MG/5ML PO SOLN: 5.0000 mg | ORAL | Status: DC | PRN

## 2014-09-04 MED ORDER — PREDNISOLONE SODIUM PHOSPHATE 15 MG/5ML PO SOLN
15.0000 mg | Freq: Two times a day (BID) | ORAL | Status: AC
Start: 2014-09-04 — End: 2014-09-06

## 2014-09-04 MED ORDER — PENICILLIN V POTASSIUM 250 MG/5ML PO SOLR: 500.0000 mg | Freq: Four times a day (QID) | ORAL | Status: DC

## 2014-09-04 MED ORDER — PREDNISOLONE 15 MG/5ML PO SOLN
30.0000 mg | Freq: Once | ORAL | Status: AC
  Administered 2014-09-04: 30 mg via ORAL
  Filled 2014-09-04: qty 2

## 2014-09-04 MED ORDER — PENICILLIN V POTASSIUM 250 MG/5ML PO SOLR
500.0000 mg | Freq: Once | ORAL | Status: AC
  Administered 2014-09-04: 500 mg via ORAL
  Filled 2014-09-04: qty 10

## 2014-09-04 NOTE — ED Notes (Signed)
Pt c/o sore throat and fever for 3-4 days.

## 2014-09-04 NOTE — ED Provider Notes (Signed)
CSN: [id number]     Arrival date & time 09/04/14  4098 History   First MD Initiated Contact with Patient 09/04/14 [phone]     Chief Complaint  Patient presents with  . Sore Throat  . Fever     HPI  Patient presents today history of sore throat fever pain with swallowing. No rash no joint pain. No abdominal pain. No difficulty breathing. Painful swallowing. Taking liquids without difficulty. No neck stiffness.  Past Medical History  Diagnosis Date  . Pregnant   . Shingles 08/22/2013    Under chin  . Placenta previa 08/28/2013  . Yeast infection   . Herpes   . Kidney stone    Past Surgical History  Procedure Laterality Date  . No past surgeries     Family History  Problem Relation Age of Onset  . Diabetes Father   . Diabetes Maternal Grandmother   . COPD Maternal Grandmother   . COPD Maternal Grandfather   . Diabetes Paternal Grandfather   . Cancer Paternal Grandfather     lung cancer   History  Substance Use Topics  . Smoking status: Current Every Day Smoker    Types: Cigarettes  . Smokeless tobacco: Never Used  . Alcohol Use: No   OB History   Grav Para Term Preterm Abortions TAB SAB Ect Mult Living   Review of Systems  Constitutional: Positive for fever. Negative for chills, diaphoresis, appetite change and fatigue.  HENT: Positive for sore throat. Negative for mouth sores and trouble swallowing.   Eyes: Negative for visual disturbance.  Respiratory: Negative for cough, chest tightness, shortness of breath and wheezing.   Cardiovascular: Negative for chest pain.  Gastrointestinal: Negative for nausea, vomiting, abdominal pain, diarrhea and abdominal distention.  Endocrine: Negative for polydipsia, polyphagia and polyuria.  Genitourinary: Negative for dysuria, frequency and hematuria.  Musculoskeletal: Positive for neck pain. Negative for gait problem.  Skin: Negative for color change, pallor and rash.  Neurological: Negative for dizziness,  syncope, light-headedness and headaches.  Hematological: Does not bruise/bleed easily.  Psychiatric/Behavioral: Negative for behavioral problems and confusion.      Allergies  Hydrocodone and Morphine and related  Home Medications   Prior to Admission medications   Medication Sig Start Date End Date Taking? Authorizing Provider  acetaminophen (TYLENOL) 325 MG tablet Take 650 mg by mouth every 6 (six) hours as needed for moderate pain.    Historical Provider, MD  cyclobenzaprine (FLEXERIL) 10 MG tablet Take 1 tablet (10 mg total) by mouth 2 (two) times daily as needed for muscle spasms. 02/04/14   Hope Orlene Och, NP  escitalopram (LEXAPRO) 10 MG tablet Take 1 tablet (10 mg total) by mouth daily. 02/05/14   Jacklyn Shell, CNM  Levonorgestrel-Ethinyl Estradiol (AMETHIA,CAMRESE) 0.15-0.03 &0.01 MG tablet Take 1 tablet by mouth daily. 02/05/14   Jacklyn Shell, CNM  metaxalone (SKELAXIN) 800 MG tablet Take 1 tablet (800 mg total) by mouth 3 (three) times daily. 02/05/14   Jacklyn Shell, CNM  oxyCODONE (ROXICODONE) 5 MG/5ML solution Take 5 mLs (5 mg total) by mouth every 4 (four) hours as needed for moderate pain or severe pain. 09/04/14   Rolland Porter, MD  oxyCODONE-acetaminophen (PERCOCET/ROXICET) 5-325 MG per tablet Take 1 tablet by mouth every 6 (six) hours as needed for moderate pain or severe pain.    Historical Provider, MD  penicillin v potassium (VEETID) 250 MG/5ML solution Take 10 mLs (500 mg  total) by mouth 4 (four) times daily. 09/04/14   Rolland Porter, MD  prednisoLONE (ORAPRED) 15 MG/5ML solution Take 5 mLs (15 mg total) by mouth 2 (two) times daily. 09/04/14 09/06/14  Rolland Porter, MD   BP 116/58  Pulse 104  Temp(Src) 100.8 F (38.2 C) (Oral)  Resp 16  SpO2 94%  LMP 09/02/2014 Physical Exam  Constitutional: She is oriented to person, place, and time. She appears well-developed and well-nourished. No distress.  HENT:  Head: Normocephalic.  Mouth/Throat:     Eyes: Conjunctivae are normal. Pupils are equal, round, and reactive to light. No scleral icterus.  Neck: Normal range of motion. Neck supple. No thyromegaly present.  Cardiovascular: Normal rate and regular rhythm.  Exam reveals no gallop and no friction rub.   No murmur heard. Pulmonary/Chest: Effort normal and breath sounds normal. No respiratory distress. She has no wheezes. She has no rales.  Abdominal: Soft. Bowel sounds are normal. She exhibits no distension. There is no tenderness. There is no rebound.  Musculoskeletal: Normal range of motion.  Neurological: She is alert and oriented to person, place, and time.  Skin: Skin is warm and dry. No rash noted.  Psychiatric: She has a normal mood and affect. Her behavior is normal.    ED Course  Procedures (including critical care time) Labs Review Labs Reviewed - No data to display  Imaging Review No results found.   EKG Interpretation None      MDM   Final diagnoses:  Pharyngitis     Pretreatment for streptococcal pharyngitis. Liquid penicillin, Orapred, oxycodone. Liquid diet. Recheck with any worsening symptoms.   Rolland Porter, MD 09/04/14 [phone]

## 2014-09-04 NOTE — Discharge Instructions (Signed)

## 2014-10-12 ENCOUNTER — Emergency Department (HOSPITAL_BASED_OUTPATIENT_CLINIC_OR_DEPARTMENT_OTHER)
Admission: EM | Admit: 2014-10-12 | Discharge: 2014-10-12 | Disposition: A | Discharge status: Home / Self Care | Payer: BC Managed Care – PPO | Attending: Emergency Medicine | Admitting: Emergency Medicine

## 2014-10-12 ENCOUNTER — Encounter (HOSPITAL_BASED_OUTPATIENT_CLINIC_OR_DEPARTMENT_OTHER): Payer: Self-pay

## 2014-10-12 DIAGNOSIS — J029 Acute pharyngitis, unspecified: Secondary | ICD-10-CM | POA: Insufficient documentation

## 2014-10-12 DIAGNOSIS — Z79899 Other long term (current) drug therapy: Secondary | ICD-10-CM | POA: Insufficient documentation

## 2014-10-12 DIAGNOSIS — Z87442 Personal history of urinary calculi: Secondary | ICD-10-CM | POA: Insufficient documentation

## 2014-10-12 DIAGNOSIS — Z8619 Personal history of other infectious and parasitic diseases: Secondary | ICD-10-CM | POA: Insufficient documentation

## 2014-10-12 DIAGNOSIS — Z792 Long term (current) use of antibiotics: Secondary | ICD-10-CM | POA: Insufficient documentation

## 2014-10-12 DIAGNOSIS — Z720 Tobacco use: Secondary | ICD-10-CM | POA: Insufficient documentation

## 2014-10-12 LAB — RAPID STREP SCREEN (MED CTR MEBANE ONLY): Streptococcus, Group A Screen (Direct): POSITIVE — AB

## 2014-10-12 MED ORDER — OXYCODONE-ACETAMINOPHEN 5-325 MG PO TABS: 1.0000 | ORAL_TABLET | Freq: Four times a day (QID) | ORAL | Status: DC | PRN

## 2014-10-12 MED ORDER — PENICILLIN V POTASSIUM 500 MG PO TABS: 500.0000 mg | ORAL_TABLET | Freq: Four times a day (QID) | ORAL | Status: AC

## 2014-10-12 MED ORDER — OXYCODONE-ACETAMINOPHEN 5-325 MG PO TABS
1.0000 | ORAL_TABLET | Freq: Once | ORAL | Status: AC
  Administered 2014-10-12: 1 via ORAL
  Filled 2014-10-12: qty 1

## 2014-10-12 NOTE — Discharge Instructions (Signed)
Strep Throat Take Tylenol for mild pain or the pain medicine prescribed for bad pain. See your local health department have if having significant pain in a week. Strep throat is an infection of the throat caused by a bacteria named Streptococcus pyogenes. Your health care provider may call the infection streptococcal "tonsillitis" or "pharyngitis" depending on whether there are signs of inflammation in the tonsils or back of the throat. Strep throat is most common in children aged 5-15 years during the cold months of the year, but it can occur in people of any age during any season. This infection is spread from person to person (contagious) through coughing, sneezing, or other close contact. SIGNS AND SYMPTOMS   Fever or chills.  Painful, swollen, red tonsils or throat.  Pain or difficulty when swallowing.  White or yellow spots on the tonsils or throat.  Swollen, tender lymph nodes or "glands" of the neck or under the jaw.  Red rash all over the body (rare). DIAGNOSIS  Many different infections can cause the same symptoms. A test must be done to confirm the diagnosis so the right treatment can be given. A "rapid strep test" can help your health care provider make the diagnosis in a few minutes. If this test is not available, a light swab of the infected area can be used for a throat culture test. If a throat culture test is done, results are usually available in a day or two. TREATMENT  Strep throat is treated with antibiotic medicine. HOME CARE INSTRUCTIONS   Gargle with 1 tsp of salt in 1 cup of warm water, 3-4 times per day or as needed for comfort.  Family members who also have a sore throat or fever should be tested for strep throat and treated with antibiotics if they have the strep infection.  Make sure everyone in your household washes their hands well.  Do not share food, drinking cups, or personal items that could cause the infection to spread to others.  You may need to eat  a soft food diet until your sore throat gets better.  Drink enough water and fluids to keep your urine clear or pale yellow. This will help prevent dehydration.  Get plenty of rest.  Stay home from school, day care, or work until you have been on antibiotics for 24 hours.  Take medicines only as directed by your health care provider.  Take your antibiotic medicine as directed by your health care provider. Finish it even if you start to feel better. SEEK MEDICAL CARE IF:   The glands in your neck continue to enlarge.  You develop a rash, cough, or earache.  You cough up green, yellow-brown, or bloody sputum.  You have pain or discomfort not controlled by medicines.  Your problems seem to be getting worse rather than better.  You have a fever. SEEK IMMEDIATE MEDICAL CARE IF:   You develop any new symptoms such as vomiting, severe headache, stiff or painful neck, chest pain, shortness of breath, or trouble swallowing.  You develop severe throat pain, drooling, or changes in your voice.  You develop swelling of the neck, or the skin on the neck becomes red and tender.  You develop signs of dehydration, such as fatigue, dry mouth, and decreased urination.  You become increasingly sleepy, or you cannot wake up completely. MAKE SURE YOU:  Understand these instructions.  Will watch your condition.  Will get help right away if you are not doing well or get worse. Document  Released: 11/25/2000 Document Revised: 04/14/2014 Document Reviewed: 01/27/2011 °[employer]® Patient Information ©2015 [employer]. This information is not intended to replace advice given to you by your health care provider. Make sure you discuss any questions you have with your health care provider. ° °

## 2014-10-12 NOTE — ED Notes (Signed)
Pt reports 2 day history of sore throat.

## 2014-10-12 NOTE — ED Provider Notes (Signed)
CSN: [id number]     Arrival date & time 10/12/14  2059 History  This chart was scribed for Doug SouSam Darl Kuss, MD by Gwenyth Oberatherine Macek, ED Scribe. This patient was seen in room MH08/MH08 and the patient's care was started at 9:59 PM.     Chief Complaint  Patient presents with  . Sore Throat   The history is provided by the patient. No language interpreter was used.    HPI Comments: Christine Day is a 23 y.o. female who presents to the Emergency Department complaining of a constant sore throat that started 2 days ago. Pain is worse with swallowing severe, nonradiatingShe states mildheadache,slight dry cough, and a fever of 103 as associated symptom. Her temperature in the ED is 100.5. Pt took penicillin, Aleve, and Ibuprofen 5 hours ago with no relief to symptoms. She states she has an adverse reaction to hydrocodone. She can take oxycodone without reliefPt smokes cigarettes, but denies drinking and drug abuse. Her LNMP was last week.   Past Medical History  Diagnosis Date  . Pregnant   . Shingles 08/22/2013    Under chin  . Placenta previa 08/28/2013  . Yeast infection   . Herpes   . Kidney stone    Past Surgical History  Procedure Laterality Date  . No past surgeries     Family History  Problem Relation Age of Onset  . Diabetes Father   . Diabetes Maternal Grandmother   . COPD Maternal Grandmother   . COPD Maternal Grandfather   . Diabetes Paternal Grandfather   . Cancer Paternal Grandfather     lung cancer   History  Substance Use Topics  . Smoking status: Current Every Day Smoker    Types: Cigarettes  . Smokeless tobacco: Never Used  . Alcohol Use: No   OB History    Gravida Para Term Preterm AB TAB SAB Ectopic Multiple Living   1 1 1       1      Review of Systems  Constitutional: Positive for fever. Negative for chills, diaphoresis, appetite change and fatigue.  HENT: Positive for sore throat. Negative for mouth sores and trouble swallowing.   Eyes: Negative for  visual disturbance.  Respiratory: Positive for cough. Negative for chest tightness, shortness of breath and wheezing.   Cardiovascular: Negative for chest pain.  Gastrointestinal: Negative for nausea, vomiting, abdominal pain, diarrhea and abdominal distention.  Endocrine: Negative for polydipsia, polyphagia and polyuria.  Genitourinary: Negative for dysuria, frequency and hematuria.  Musculoskeletal: Negative for gait problem.  Skin: Negative for color change, pallor and rash.  Neurological: Negative for dizziness, syncope, light-headedness and headaches.  Hematological: Does not bruise/bleed easily.  Psychiatric/Behavioral: Negative for behavioral problems and confusion.  All other systems reviewed and are negative.     Allergies  Hydrocodone and Morphine and related  Home Medications   Prior to Admission medications   Medication Sig Start Date End Date Taking? Authorizing Provider  acetaminophen (TYLENOL) 325 MG tablet Take 650 mg by mouth every 6 (six) hours as needed for moderate pain.    Historical Provider, MD  cyclobenzaprine (FLEXERIL) 10 MG tablet Take 1 tablet (10 mg total) by mouth 2 (two) times daily as needed for muscle spasms. 02/04/14   Hope Orlene OchM Neese, NP  escitalopram (LEXAPRO) 10 MG tablet Take 1 tablet (10 mg total) by mouth daily. 02/05/14   Jacklyn ShellFrances Cresenzo-Dishmon, CNM  Levonorgestrel-Ethinyl Estradiol (AMETHIA,CAMRESE) 0.15-0.03 &0.01 MG tablet Take 1 tablet by mouth daily. 02/05/14   Jacklyn ShellFrances Cresenzo-Dishmon, CNM  metaxalone (SKELAXIN) 800 MG tablet Take 1 tablet (800 mg total) by mouth 3 (three) times daily. 02/05/14   Jacklyn ShellFrances Cresenzo-Dishmon, CNM  oxyCODONE (ROXICODONE) 5 MG/5ML solution Take 5 mLs (5 mg total) by mouth every 4 (four) hours as needed for moderate pain or severe pain. 09/04/14   Rolland PorterMark James, MD  oxyCODONE-acetaminophen (PERCOCET/ROXICET) 5-325 MG per tablet Take 1 tablet by mouth every 6 (six) hours as needed for moderate pain or severe pain.     Historical Provider, MD  penicillin v potassium (VEETID) 250 MG/5ML solution Take 10 mLs (500 mg total) by mouth 4 (four) times daily. 09/04/14   Rolland PorterMark James, MD   BP 126/80 mmHg  Pulse 125  Temp(Src) 100.5 F (38.1 C) (Oral)  Resp 20  Ht 5\' 6"  (1.676 m)  Wt 130 lb (58.968 kg)  BMI 20.99 kg/m2  SpO2 97%  LMP 10/05/2014 Physical Exam  Constitutional: She appears well-developed and well-nourished. No distress.  HENT:  Head: Normocephalic and atraumatic.  Right Ear: External ear normal.  Left Ear: External ear normal.  Tonsils large red with white exudate  Eyes: Conjunctivae are normal. Pupils are equal, round, and reactive to light.  Neck: Neck supple. No tracheal deviation present. No thyromegaly present.  Cardiovascular: Regular rhythm.   No murmur heard. Mildly tachycardic  Pulmonary/Chest: Effort normal and breath sounds normal.  Abdominal: Soft. Bowel sounds are normal. She exhibits no distension. There is no tenderness.  Musculoskeletal: Normal range of motion. She exhibits no edema or tenderness.  Neurological: She is alert. Coordination normal.  Skin: Skin is warm and dry. No rash noted.  Psychiatric: She has a normal mood and affect.  Nursing note and vitals reviewed.   ED Course  Procedures (including critical care time) DIAGNOSTIC STUDIES: Oxygen Saturation is 97% on RA, normal by my interpretation.    COORDINATION OF CARE: 10:03 PM Discussed treatment plan with pt at bedside and pt agreed to plan.  Labs Review Labs Reviewed  RAPID STREP SCREEN - Abnormal; Notable for the following:    Streptococcus, Group A Screen (Direct) POSITIVE (*)    All other components within normal limits    Imaging Review No results found.   EKG Interpretation None     Results for orders placed or performed during the hospital encounter of 10/12/14  Rapid strep screen  Result Value Ref Range   Streptococcus, Group A Screen (Direct) POSITIVE (A) NEGATIVE   No results  found. 2300 p.m. Pain not improved after treatment with Percocet but patient feels ready to go home MDM  Plan prescription and oxycodone, Pen-Vee K. Follow-up with health Department if not better one week or so has fever Final diagnoses:  None   Diagnosis strep pharyngitis   I personally performed the services described in this documentation, which was scribed in my presence. The recorded information has been reviewed and considered.    Doug SouSam Peirce Deveney, MD 10/12/14 [phone]

## 2014-10-13 ENCOUNTER — Encounter (HOSPITAL_BASED_OUTPATIENT_CLINIC_OR_DEPARTMENT_OTHER): Payer: Self-pay

## 2015-10-13 ENCOUNTER — Emergency Department (HOSPITAL_BASED_OUTPATIENT_CLINIC_OR_DEPARTMENT_OTHER): Payer: Medicaid Other

## 2015-10-13 ENCOUNTER — Emergency Department (HOSPITAL_BASED_OUTPATIENT_CLINIC_OR_DEPARTMENT_OTHER)
Admission: EM | Admit: 2015-10-13 | Discharge: 2015-10-13 | Disposition: A | Discharge status: Home / Self Care | Payer: Medicaid Other | Attending: Emergency Medicine | Admitting: Emergency Medicine

## 2015-10-13 ENCOUNTER — Encounter (HOSPITAL_BASED_OUTPATIENT_CLINIC_OR_DEPARTMENT_OTHER): Payer: Self-pay | Admitting: Emergency Medicine

## 2015-10-13 DIAGNOSIS — S9031XA Contusion of right foot, initial encounter: Secondary | ICD-10-CM | POA: Diagnosis not present

## 2015-10-13 DIAGNOSIS — S99921A Unspecified injury of right foot, initial encounter: Secondary | ICD-10-CM | POA: Diagnosis present

## 2015-10-13 DIAGNOSIS — Y9289 Other specified places as the place of occurrence of the external cause: Secondary | ICD-10-CM | POA: Insufficient documentation

## 2015-10-13 DIAGNOSIS — Z87442 Personal history of urinary calculi: Secondary | ICD-10-CM | POA: Diagnosis not present

## 2015-10-13 DIAGNOSIS — Z792 Long term (current) use of antibiotics: Secondary | ICD-10-CM | POA: Insufficient documentation

## 2015-10-13 DIAGNOSIS — Y998 Other external cause status: Secondary | ICD-10-CM | POA: Insufficient documentation

## 2015-10-13 DIAGNOSIS — Z8619 Personal history of other infectious and parasitic diseases: Secondary | ICD-10-CM | POA: Diagnosis not present

## 2015-10-13 DIAGNOSIS — Y9389 Activity, other specified: Secondary | ICD-10-CM | POA: Diagnosis not present

## 2015-10-13 DIAGNOSIS — M79671 Pain in right foot: Secondary | ICD-10-CM

## 2015-10-13 DIAGNOSIS — Z79899 Other long term (current) drug therapy: Secondary | ICD-10-CM | POA: Insufficient documentation

## 2015-10-13 DIAGNOSIS — W231XXA Caught, crushed, jammed, or pinched between stationary objects, initial encounter: Secondary | ICD-10-CM | POA: Insufficient documentation

## 2015-10-13 DIAGNOSIS — T148XXA Other injury of unspecified body region, initial encounter: Secondary | ICD-10-CM

## 2015-10-13 DIAGNOSIS — Z720 Tobacco use: Secondary | ICD-10-CM | POA: Diagnosis not present

## 2015-10-13 NOTE — ED Provider Notes (Signed)
CSN: [id number]     Arrival date & time 10/13/15  1925 History   First MD Initiated Contact with Patient 10/13/15 2117     Chief Complaint  Patient presents with  . Foot Pain     (Consider location/radiation/quality/duration/timing/severity/associated sxs/prior Treatment) Patient is a 24 y.o. female presenting with foot injury.  Foot Injury Location:  Foot Injury: yes   Mechanism of injury comment:  Slammed in car door Foot location:  R foot Pain details:    Quality:  Shooting and sharp   Radiates to:  Does not radiate   Severity:  Severe   Onset quality:  Sudden   Timing:  Constant   Progression:  Unchanged Chronicity:  New Prior injury to area:  No Relieved by:  Nothing Worsened by:  Bearing weight and activity Associated symptoms: swelling   Associated symptoms: no back pain, no fever, no neck pain, no numbness and no tingling     Past Medical History  Diagnosis Date  . Pregnant   . Shingles 08/22/2013    Under chin  . Placenta previa 08/28/2013  . Yeast infection   . Herpes   . Kidney stone    Past Surgical History  Procedure Laterality Date  . No past surgeries     Family History  Problem Relation Age of Onset  . Diabetes Father   . Diabetes Maternal Grandmother   . COPD Maternal Grandmother   . COPD Maternal Grandfather   . Diabetes Paternal Grandfather   . Cancer Paternal Grandfather     lung cancer   Social History  Substance Use Topics  . Smoking status: Current Every Day Smoker    Types: Cigarettes  . Smokeless tobacco: Never Used  . Alcohol Use: No   OB History    Gravida Para Term Preterm AB TAB SAB Ectopic Multiple Living   Review of Systems  Constitutional: Negative for fever.  Musculoskeletal: Negative for back pain and neck pain.  All other systems reviewed and are negative.     Allergies  Hydrocodone and Morphine and related  Home Medications   Prior to Admission medications   Medication Sig Start Date  End Date Taking? Authorizing Provider  acetaminophen (TYLENOL) 325 MG tablet Take 650 mg by mouth every 6 (six) hours as needed for moderate pain.    Historical Provider, MD  cyclobenzaprine (FLEXERIL) 10 MG tablet Take 1 tablet (10 mg total) by mouth 2 (two) times daily as needed for muscle spasms. 02/04/14   Hope Orlene Och, NP  escitalopram (LEXAPRO) 10 MG tablet Take 1 tablet (10 mg total) by mouth daily. 02/05/14   Jacklyn Shell, CNM  Levonorgestrel-Ethinyl Estradiol (AMETHIA,CAMRESE) 0.15-0.03 &0.01 MG tablet Take 1 tablet by mouth daily. 02/05/14   Jacklyn Shell, CNM  metaxalone (SKELAXIN) 800 MG tablet Take 1 tablet (800 mg total) by mouth 3 (three) times daily. 02/05/14   Jacklyn Shell, CNM  oxyCODONE (ROXICODONE) 5 MG/5ML solution Take 5 mLs (5 mg total) by mouth every 4 (four) hours as needed for moderate pain or severe pain. 09/04/14   Rolland Porter, MD  oxyCODONE-acetaminophen (PERCOCET) 5-325 MG per tablet Take 1-2 tablets by mouth every 6 (six) hours as needed. 10/12/14   Doug Sou, MD  oxyCODONE-acetaminophen (PERCOCET/ROXICET) 5-325 MG per tablet Take 1 tablet by mouth every 6 (six) hours as needed for moderate pain or severe pain.    Historical Provider, MD  penicillin v potassium (  VEETID) 250 MG/5ML solution Take 10 mLs (500 mg total) by mouth 4 (four) times daily. 09/04/14   Rolland PorterMark James, MD   BP 130/85 mmHg  Pulse 66  Temp(Src) 98.2 F (36.8 C) (Oral)  Resp 20  Ht 5\' 6"  (1.676 m)  Wt 134 lb (60.782 kg)  BMI 21.64 kg/m2  SpO2 99% Physical Exam  Constitutional: She is oriented to person, place, and time. She appears well-developed and well-nourished.  HENT:  Head: Normocephalic and atraumatic.  Right Ear: External ear normal.  Left Ear: External ear normal.  Eyes: Conjunctivae and EOM are normal. Pupils are equal, round, and reactive to light.  Neck: Normal range of motion. Neck supple.  Cardiovascular: Normal rate, regular rhythm, normal heart  sounds and intact distal pulses.   Pulmonary/Chest: Effort normal and breath sounds normal.  Abdominal: Soft. Bowel sounds are normal. There is no tenderness.  Musculoskeletal: Normal range of motion.       Right foot: There is tenderness and bony tenderness. Decreased range of motion: with bruising over dorsal surface of 3rd MTP.  Neurological: She is alert and oriented to person, place, and time.  Skin: Skin is warm and dry.  Vitals reviewed.   ED Course  Procedures (including critical care time) Labs Review Labs Reviewed - No data to display  Imaging Review Dg Foot Complete Right  10/13/2015  CLINICAL DATA:  Right foot injury, slammed foot in car door this evening EXAM: RIGHT FOOT COMPLETE - 3+ VIEW COMPARISON:  None. FINDINGS: Three views of the right foot submitted. No acute fracture or subluxation. No radiopaque foreign body. IMPRESSION: Negative. Electronically Signed   By: Natasha MeadLiviu  Pop M.D.   On: 10/13/2015 20:09   I have personally reviewed and evaluated these images and lab results as part of my medical decision-making.   EKG Interpretation None      MDM   Final diagnoses:  Right foot pain  Contusion    24 y.o. female without pertinent PMH presents with foot pain after her foot lotion in a car door. Exam with tenderness and bruising, good range of motion.  Workup unremarkable. Likely contusion. Discharged home in stable condition.    I have reviewed all laboratory and imaging studies if ordered as above  1. Right foot pain   2. Contusion         Mirian MoMatthew Gentry, MD 10/15/15 [phone]

## 2015-10-13 NOTE — ED Notes (Signed)
Patient reports that she hurt her right foot when hitting it in a car door.

## 2015-10-13 NOTE — ED Notes (Signed)
Pt not in room assigned and new pt  Now occupying her room

## 2015-10-13 NOTE — Discharge Instructions (Signed)

## 2019-07-02 ENCOUNTER — Encounter: Payer: Self-pay | Admitting: Family Medicine

## 2019-08-19 ENCOUNTER — Emergency Department (HOSPITAL_COMMUNITY): Payer: Medicaid Other

## 2019-08-19 ENCOUNTER — Other Ambulatory Visit: Payer: Self-pay

## 2019-08-19 ENCOUNTER — Emergency Department (HOSPITAL_COMMUNITY)
Admission: EM | Admit: 2019-08-19 | Discharge: 2019-08-19 | Disposition: A | Discharge status: Home / Self Care | Payer: Medicaid Other | Attending: Emergency Medicine | Admitting: Emergency Medicine

## 2019-08-19 ENCOUNTER — Encounter (HOSPITAL_COMMUNITY): Payer: Self-pay | Admitting: Emergency Medicine

## 2019-08-19 DIAGNOSIS — N10 Acute tubulo-interstitial nephritis: Secondary | ICD-10-CM | POA: Insufficient documentation

## 2019-08-19 DIAGNOSIS — N12 Tubulo-interstitial nephritis, not specified as acute or chronic: Secondary | ICD-10-CM

## 2019-08-19 DIAGNOSIS — F17210 Nicotine dependence, cigarettes, uncomplicated: Secondary | ICD-10-CM | POA: Insufficient documentation

## 2019-08-19 DIAGNOSIS — R1032 Left lower quadrant pain: Secondary | ICD-10-CM | POA: Diagnosis present

## 2019-08-19 LAB — CBC WITH DIFFERENTIAL/PLATELET
Abs Immature Granulocytes: 0.05 10*3/uL (ref 0.00–0.07)
Basophils Absolute: 0 10*3/uL (ref 0.0–0.1)
Basophils Relative: 0 %
Eosinophils Absolute: 0 10*3/uL (ref 0.0–0.5)
Eosinophils Relative: 0 %
HCT: 43 % (ref 36.0–46.0)
Hemoglobin: 14.2 g/dL (ref 12.0–15.0)
Immature Granulocytes: 1 %
Lymphocytes Relative: 10 %
Lymphs Abs: 0.9 10*3/uL (ref 0.7–4.0)
MCH: 34.5 pg — ABNORMAL HIGH (ref 26.0–34.0)
MCHC: 33 g/dL (ref 30.0–36.0)
MCV: 104.6 fL — ABNORMAL HIGH (ref 80.0–100.0)
Monocytes Absolute: 0.7 10*3/uL (ref 0.1–1.0)
Monocytes Relative: 7 %
Neutro Abs: 7.9 10*3/uL — ABNORMAL HIGH (ref 1.7–7.7)
Neutrophils Relative %: 82 %
Platelets: 179 10*3/uL (ref 150–400)
RBC: 4.11 MIL/uL (ref 3.87–5.11)
RDW: 12.7 % (ref 11.5–15.5)
WBC: 9.6 10*3/uL (ref 4.0–10.5)
nRBC: 0 % (ref 0.0–0.2)

## 2019-08-19 LAB — BASIC METABOLIC PANEL
Anion gap: 10 (ref 5–15)
BUN: 8 mg/dL (ref 6–20)
CO2: 23 mmol/L (ref 22–32)
Calcium: 9.6 mg/dL (ref 8.9–10.3)
Chloride: 101 mmol/L (ref 98–111)
Creatinine, Ser: 0.67 mg/dL (ref 0.44–1.00)
GFR calc Af Amer: >60 mL/min (ref >60)
GFR calc non Af Amer: >60 mL/min (ref >60)
Glucose, Bld: 109 mg/dL — ABNORMAL HIGH (ref 70–99)
Potassium: 3.9 mmol/L (ref 3.5–5.1)
Sodium: 134 mmol/L — ABNORMAL LOW (ref 135–145)

## 2019-08-19 LAB — URINALYSIS, ROUTINE W REFLEX MICROSCOPIC
Bilirubin Urine: NEGATIVE
Glucose, UA: NEGATIVE mg/dL
Ketones, ur: 20 mg/dL — AB
Nitrite: POSITIVE — AB
Protein, ur: 100 mg/dL — AB
Specific Gravity, Urine: 1.014 (ref 1.005–1.030)
WBC, UA: >50 WBC/hpf — ABNORMAL HIGH (ref 0–5)
pH: 6 (ref 5.0–8.0)

## 2019-08-19 LAB — PREGNANCY, URINE: Preg Test, Ur: NEGATIVE

## 2019-08-19 MED ORDER — SODIUM CHLORIDE 0.9 % IV SOLN
1.0000 g | Freq: Once | INTRAVENOUS | Status: AC
  Administered 2019-08-19: 18:00:00 1 g via INTRAVENOUS
  Filled 2019-08-19: qty 10

## 2019-08-19 MED ORDER — KETOROLAC TROMETHAMINE 30 MG/ML IJ SOLN
15.0000 mg | Freq: Once | INTRAMUSCULAR | Status: AC
  Administered 2019-08-19: 15 mg via INTRAVENOUS
  Filled 2019-08-19: qty 1

## 2019-08-19 MED ORDER — OXYCODONE-ACETAMINOPHEN 5-325 MG PO TABS
1.0000 | ORAL_TABLET | Freq: Once | ORAL | Status: AC
  Administered 2019-08-19: 20:00:00 1 via ORAL
  Filled 2019-08-19: qty 1

## 2019-08-19 MED ORDER — MORPHINE SULFATE (PF) 4 MG/ML IV SOLN
4.0000 mg | Freq: Once | INTRAVENOUS | Status: AC
  Administered 2019-08-19: 4 mg via INTRAVENOUS
  Filled 2019-08-19: qty 1

## 2019-08-19 MED ORDER — CEPHALEXIN 500 MG PO CAPS: 500.0000 mg | ORAL_CAPSULE | Freq: Three times a day (TID) | ORAL | 0 refills | Status: DC

## 2019-08-19 NOTE — ED Notes (Signed)
Family at bedside. 

## 2019-08-19 NOTE — ED Triage Notes (Signed)
PT c/o left sided flank x2 days with some blood noted in her urine. PT states hx of kidney stones.

## 2019-08-22 LAB — URINE CULTURE: Culture: >=100000 — AB

## 2019-08-23 ENCOUNTER — Telehealth: Payer: Self-pay | Admitting: *Deleted

## 2019-08-23 NOTE — Telephone Encounter (Signed)
Post ED Visit - Positive Culture Follow-up  Culture report reviewed by antimicrobial stewardship pharmacist: [place] Team []  Elenor Quinones, Pharm.D. []  Heide Guile, Pharm.D., BCPS AQ-ID []  Parks Neptune, Pharm.D., BCPS []  Alycia Rossetti, Pharm.D., BCPS []  [place], Pharm.D., BCPS, AAHIVP []  Legrand Como, Pharm.D., BCPS, AAHIVP []  Salome Arnt, PharmD, BCPS []  Johnnette Gourd, PharmD, BCPS []  Hughes Better, PharmD, BCPS []  Leeroy Cha, PharmD []  Laqueta Linden, PharmD, BCPS []  Albertina Parr, PharmD Lavona Mound, PharmD  [place] Team []  Leodis Sias, PharmD []  Lindell Spar, PharmD []  Royetta Asal, PharmD []  Graylin Shiver, Rph []  Rema Fendt) Glennon Mac, PharmD []  Arlyn Dunning, PharmD []  Netta Cedars, PharmD []  Dia Sitter, PharmD []  Leone Haven, PharmD []  Gretta Arab, PharmD []  Theodis Shove, PharmD []  Peggyann Juba, PharmD []  Reuel Boom, PharmD   Positive urine culture Treated with Cephalexin, organism sensitive to the same and no further patient follow-up is required at this time.  Harlon Flor [facility] 08/23/2019, 1:04 PM

## 2019-08-25 NOTE — ED Provider Notes (Signed)
[facility]ERGENCY DEPARTMENT Provider Note   CSN: [id number] Arrival date & time: 08/19/19  1438     History   Chief Complaint Chief Complaint  Patient presents with   Flank Pain    HPI Christine Day is a 28 y.o. female.     HPI   28 year old female with left flank pain.  Onset 2 days ago.  Persistent since then.  She has a past history of stones and is concerned that her current symptoms may pain spreading.  No dysuria but has noticed blood in her urine.  No fevers or chills.  Nausea, but no vomiting.  Past Medical History:  Diagnosis Date   Herpes    Kidney stone    Placenta previa 08/28/2013   Pregnant    Shingles 08/22/2013   Under chin   Yeast infection     Patient Active Problem List   Diagnosis Date Noted   Anxiety and depression 10/28/2013   Cigarette smoker 10/17/2013   Shingles 08/22/2013   Kidney stone     Past Surgical History:  Procedure Laterality Date   NO PAST SURGERIES       OB History    Gravida  3   Para  1   Term  1   Preterm      AB      Living  2     SAB      TAB      Ectopic      Multiple      Live Births  1            Home Medications    Prior to Admission medications   Medication Sig Start Date End Date Taking? Authorizing Provider  buprenorphine-naloxone (SUBOXONE) 8-2 mg SUBL SL tablet Place 1 tablet under the tongue 2 (two) times daily.  08/13/19  Yes [provider]  cephALEXin (KEFLEX) 500 MG capsule Take 1 capsule (500 mg total) by mouth 3 (three) times daily. 08/19/19   Raeford RazorKohut, Duwan Adrian, MD    Family History Family History  Problem Relation Age of Onset   Diabetes Father    Diabetes Maternal Grandmother    COPD Maternal Grandmother    COPD Maternal Grandfather    Diabetes Paternal Grandfather    Cancer Paternal Grandfather        lung cancer    Social History Social History   Tobacco Use   Smoking status: Current Every Day Smoker    Packs/day: 0.50   Types: Cigarettes   Smokeless tobacco: Never Used  Substance Use Topics   Alcohol use: No   Drug use: No     Allergies   Hydrocodone and Morphine and related   Review of Systems Review of Systems  All systems reviewed and negative, other than as noted in HPI.  Physical Exam Updated Vital Signs BP 112/76    Pulse 98    Temp 100.1 F (37.8 C) (Oral)    Resp 17    Ht 5\' 6"  (1.676 m)    Wt 68 kg    SpO2 99%    BMI 24.21 kg/m   Physical Exam Vitals signs and nursing note reviewed.  Constitutional:      General: She is not in acute distress.    Appearance: She is well-developed.  HENT:     Head: Normocephalic and atraumatic.  Eyes:     General:        Right eye: No discharge.  Left eye: No discharge.     Conjunctiva/sclera: Conjunctivae normal.  Neck:     Musculoskeletal: Neck supple.  Cardiovascular:     Rate and Rhythm: Normal rate and regular rhythm.     Heart sounds: Normal heart sounds. No murmur. No friction rub. No gallop.   Pulmonary:     Effort: Pulmonary effort is normal. No respiratory distress.     Breath sounds: Normal breath sounds.  Abdominal:     General: There is no distension.     Palpations: Abdomen is soft.     Tenderness: There is no abdominal tenderness.  Genitourinary:    Comments: Left CVA tenderness Musculoskeletal:        General: No tenderness.  Skin:    General: Skin is warm and dry.  Neurological:     Mental Status: She is alert.  Psychiatric:        Behavior: Behavior normal.        Thought Content: Thought content normal.      ED Treatments / Results  Labs (all labs ordered are listed, but only abnormal results are displayed) Labs Reviewed  URINE CULTURE - Abnormal; Notable for the following components:      Result Value   Culture >=100,000 COLONIES/mL ESCHERICHIA COLI (*)    Organism ID, Bacteria ESCHERICHIA COLI (*)    All other components within normal limits  URINALYSIS, ROUTINE W REFLEX MICROSCOPIC -  Abnormal; Notable for the following components:   APPearance CLOUDY (*)    Hgb urine dipstick MODERATE (*)    Ketones, ur 20 (*)    Protein, ur 100 (*)    Nitrite POSITIVE (*)    Leukocytes,Ua LARGE (*)    WBC, UA >50 (*)    Bacteria, UA MANY (*)    All other components within normal limits  BASIC METABOLIC PANEL - Abnormal; Notable for the following components:   Sodium 134 (*)    Glucose, Bld 109 (*)    All other components within normal limits  CBC WITH DIFFERENTIAL/PLATELET - Abnormal; Notable for the following components:   MCV 104.6 (*)    MCH 34.5 (*)    Neutro Abs 7.9 (*)    All other components within normal limits  PREGNANCY, URINE    EKG None  Radiology No results found.   Ct Renal Stone Study  Result Date: 08/19/2019 CLINICAL DATA:  Left-sided flank pain. EXAM: CT ABDOMEN AND PELVIS WITHOUT CONTRAST TECHNIQUE: Multidetector CT imaging of the abdomen and pelvis was performed following the standard protocol without IV contrast. COMPARISON:  November 22, 2008 FINDINGS: Lower chest: No acute abnormality. Hepatobiliary: No focal liver abnormality is seen. No gallstones, gallbladder wall thickening, or biliary dilatation. Pancreas: Unremarkable. No pancreatic ductal dilatation or surrounding inflammatory changes. Spleen: Normal in size without focal abnormality. Adrenals/Urinary Tract: Normal adrenal glands. Normal right kidney, ureter and urinary bladder. Mild left perirenal and periureteral fat stranding. The left ureter is slightly dilated proximally. No obstructing stone is seen. Stomach/Bowel: Stomach is within normal limits. No evidence of appendicitis. No evidence of bowel wall thickening, distention, or inflammatory changes. Vascular/Lymphatic: No significant vascular findings are present. No enlarged abdominal or pelvic lymph nodes. Reproductive: Uterus and bilateral adnexa are unremarkable. Large phleboliths within the left adnexa. Other: No abdominal wall hernia or  abnormality. No abdominopelvic ascites. Musculoskeletal: No acute or significant osseous findings. IMPRESSION: 1. Left perirenal and periureteral fat stranding with mild dilation of the proximal left ureter. No obstructing stone is seen. These findings may represent  sequela of recently passed stone or infectious/inflammatory changes. 2. No evidence of nephrolithiasis or obstructive uropathy. Electronically Signed   By: Ted Mcalpine M.D.   On: 08/19/2019 18:04    Procedures Procedures (including critical care time)  Medications Ordered in ED Medications  cefTRIAXone (ROCEPHIN) 1 g in sodium chloride 0.9 % 100 mL IVPB (0 g Intravenous Stopped 08/19/19 1820)  ketorolac (TORADOL) 30 MG/ML injection 15 mg (15 mg Intravenous Given 08/19/19 1738)  morphine 4 MG/ML injection 4 mg (4 mg Intravenous Given 08/19/19 1738)  oxyCODONE-acetaminophen (PERCOCET/ROXICET) 5-325 MG per tablet 1 tablet (1 tablet Oral Given 08/19/19 2002)     Initial Impression / Assessment and Plan / ED Course  I have reviewed the triage vital signs and the nursing notes.  Pertinent labs & imaging results that were available during my care of the patient were reviewed by me and considered in my medical decision making (see chart for details).        28 year old female with symptoms and work-up consistent nephritis.  I think she is fine for outpatient treatment at this time.  Antibiotics.  Return precautions discussed. Final Clinical Impressions(s) / ED Diagnoses   Final diagnoses:  Pyelonephritis    ED Discharge Orders         Ordered    cephALEXin (KEFLEX) 500 MG capsule  3 times daily     08/19/19 1928           Raeford Razor, MD 08/25/19 1303

## 2019-08-27 ENCOUNTER — Other Ambulatory Visit: Payer: Self-pay

## 2019-08-27 ENCOUNTER — Ambulatory Visit (INDEPENDENT_AMBULATORY_CARE_PROVIDER_SITE_OTHER): Payer: Medicaid Other | Admitting: Gastroenterology

## 2019-08-27 ENCOUNTER — Encounter: Payer: Self-pay | Admitting: Gastroenterology

## 2019-08-27 VITALS — BP 111/73 | HR 97 | Temp 98.5°F | Ht 66.0 in | Wt 160.8 lb

## 2019-08-27 DIAGNOSIS — B182 Chronic viral hepatitis C: Secondary | ICD-10-CM

## 2019-08-27 NOTE — Progress Notes (Signed)
Gastroenterology Consultation  Referring Provider:     Duffy RhodyPartridge, Tanillya, FNP Primary Care Physician[facility]  [facility] Primary Gastroenterologist:  Dr. Servando SnareWohl     Reason for Consultation:     Hepatitis C        HPI:   Christine Day is a 28 y.o. y/o female referred for consultation & management of hepatitis C by Dr. Randell [facility], [facility].  This patient comes in today after being found to have hepatitis C on blood work that was done back in July of this year.  At that time the patient's hepatitis C antibody was greater than 11.  Her AST was 96 with an ALT of 72.  The [facility] week for presumed pyelonephritis. The patient endorses drinking multiple times during the week with at least drinking 3 days a week.  The patient reports that she has snorted cocaine with her boyfriend who is also positive for hepatitis C.  The patient denies any homemade tattoos although she does have tattoos.  She also denies any blood transfusions in the past and has denied any IV drug use in the past.  There is no report of any jaundice or working in [employer].  Past Medical History:  Diagnosis Date  . Herpes   . Kidney stone   . Placenta previa 08/28/2013  . Pregnant   . Shingles 08/22/2013   Under chin  . Yeast infection     Past Surgical History:  Procedure Laterality Date  . NO PAST SURGERIES      Prior to Admission medications   Medication Sig Start Date End Date Taking? Authorizing Provider  buprenorphine-naloxone (SUBOXONE) 8-2 mg SUBL SL tablet Place 1 tablet under the tongue 2 (two) times daily.  08/13/19   [provider]  cephALEXin (KEFLEX) 500 MG capsule Take 1 capsule (500 mg total) by mouth 3 (three) times daily. 08/19/19   Raeford RazorKohut, Stephen, MD    Family History  Problem Relation Age of Onset  . Diabetes Father   . Diabetes Maternal Grandmother   . COPD Maternal  Grandmother   . COPD Maternal Grandfather   . Diabetes Paternal Grandfather   . Cancer Paternal Grandfather        lung cancer     Social History   Tobacco Use  . Smoking status: Current Every Day Smoker    Packs/day: 0.50    Types: Cigarettes  . Smokeless tobacco: Never Used  Substance Use Topics  . Alcohol use: Yes    Comment: occasional  . Drug use: No    Allergies as of 08/27/2019 - Review Complete 08/27/2019  Allergen Reaction Noted  . Hydrocodone Hives 06/24/2013  . Morphine and related Rash 10/07/2013    Review of Systems:    All systems reviewed and negative except where noted in HPI.   Physical Exam:  BP 111/73   Pulse 97   Temp 98.5 F (36.9 C) (Oral)   Ht 5\' 6"  (1.676 m)   Wt 160 lb 12.8 oz (72.9 kg)   BMI 25.95 kg/m  No LMP recorded. (Menstrual status: Irregular Periods). General:   Alert,  Well-developed, well-nourished, pleasant and cooperative in NAD Head:  Normocephalic and atraumatic. Eyes:  Sclera clear, no icterus.   Conjunctiva pink. Ears:  Normal auditory acuity. Nose:  No deformity, discharge, or lesions. Mouth:  No deformity or lesions,oropharynx pink & moist. Neck:  Supple; no masses  or thyromegaly. Lungs:  Respirations even and unlabored.  Clear throughout to auscultation.   No wheezes, crackles, or rhonchi. No acute distress. Heart:  Regular rate and rhythm; no murmurs, clicks, rubs, or gallops. Abdomen:  Normal bowel sounds.  No bruits.  Soft, non-tender and non-distended without masses, hepatosplenomegaly or hernias noted.  No guarding or rebound tenderness.  Negative Carnett sign.   Rectal:  Deferred.  Msk:  Symmetrical without gross deformities.  Good, equal movement & strength bilaterally. Pulses:  Normal pulses noted. Extremities:  No clubbing or edema.  No cyanosis. Neurologic:  Alert and oriented x3;  grossly normal neurologically. Skin:  Intact without significant lesions or rashes.  No jaundice. Lymph Nodes:  No significant  cervical adenopathy. Psych:  Alert and cooperative. Normal mood and affect.  Imaging Studies: Ct Renal Stone Study  Result Date: 08/19/2019 CLINICAL DATA:  Left-sided flank pain. EXAM: CT ABDOMEN AND PELVIS WITHOUT CONTRAST TECHNIQUE: Multidetector CT imaging of the abdomen and pelvis was performed following the standard protocol without IV contrast. COMPARISON:  November 22, 2008 FINDINGS: Lower chest: No acute abnormality. Hepatobiliary: No focal liver abnormality is seen. No gallstones, gallbladder wall thickening, or biliary dilatation. Pancreas: Unremarkable. No pancreatic ductal dilatation or surrounding inflammatory changes. Spleen: Normal in size without focal abnormality. Adrenals/Urinary Tract: Normal adrenal glands. Normal right kidney, ureter and urinary bladder. Mild left perirenal and periureteral fat stranding. The left ureter is slightly dilated proximally. No obstructing stone is seen. Stomach/Bowel: Stomach is within normal limits. No evidence of appendicitis. No evidence of bowel wall thickening, distention, or inflammatory changes. Vascular/Lymphatic: No significant vascular findings are present. No enlarged abdominal or pelvic lymph nodes. Reproductive: Uterus and bilateral adnexa are unremarkable. Large phleboliths within the left adnexa. Other: No abdominal wall hernia or abnormality. No abdominopelvic ascites. Musculoskeletal: No acute or significant osseous findings. IMPRESSION: 1. Left perirenal and periureteral fat stranding with mild dilation of the proximal left ureter. No obstructing stone is seen. These findings may represent sequela of recently passed stone or infectious/inflammatory changes. 2. No evidence of nephrolithiasis or obstructive uropathy. Electronically Signed   By: Fidela Salisbury M.D.   On: 08/19/2019 18:04    Assessment and Plan:   Christine Day is a 28 y.o. y/o female who comes in today with a abnormal hepatitis C antibody test being positive.  The  patient will have her lab sent off for possible other causes of abnormal liver enzymes.  She will also have her immunity checked for both hepatitis A and hepatitis B.  Her hepatitis C genotype will be sent off as well as a fibrosure the patient has been told to stop drinking so that she can be treated for her hepatitis C and to stop any further damage from the alcohol.  She will also be contacted when her labs are back with the results and then will be set up for treatment for her hepatitis C.  The patient has been explained the plan and agrees with it.  Lucilla Lame, MD. Marval Regal    Note: This dictation was prepared with Dragon dictation along with smaller phrase technology. Any transcriptional errors that result from this process are unintentional.

## 2019-08-30 LAB — HCV FIBROSURE
ALPHA 2-MACROGLOBULINS, QN: 194 mg/dL (ref 110–276)
ALT (SGPT) P5P: 29 IU/L (ref 0–40)
Apolipoprotein A-1: 144 mg/dL (ref 116–209)
Bilirubin, Total: 0.2 mg/dL (ref 0.0–1.2)
Fibrosis Score: 0.07 (ref 0.00–0.21)
GGT: 94 IU/L — ABNORMAL HIGH (ref 0–60)
Haptoglobin: 195 mg/dL (ref 33–278)
Necroinflammat Activity Score: 0.1 (ref 0.00–0.17)

## 2019-08-30 LAB — HEPATITIS A ANTIBODY, TOTAL: hep A Total Ab: NEGATIVE

## 2019-08-30 LAB — HEPATITIS B SURFACE ANTIGEN: Hepatitis B Surface Ag: NEGATIVE

## 2019-08-30 LAB — HEPATIC FUNCTION PANEL
ALT: 27 IU/L (ref 0–32)
AST: 27 IU/L (ref 0–40)
Albumin: 4.6 g/dL (ref 3.9–5.0)
Alkaline Phosphatase: 97 IU/L (ref 39–117)
Bilirubin Total: 0.4 mg/dL (ref 0.0–1.2)
Bilirubin, Direct: 0.15 mg/dL (ref 0.00–0.40)
Total Protein: 6.9 g/dL (ref 6.0–8.5)

## 2019-08-30 LAB — HCV RNA QUANT: Hepatitis C Quantitation: <15 IU/mL

## 2019-08-30 LAB — ANA: Anti Nuclear Antibody (ANA): NEGATIVE

## 2019-08-30 LAB — HEPATITIS C GENOTYPE

## 2019-08-30 LAB — ALPHA-1-ANTITRYPSIN: A-1 Antitrypsin: 160 mg/dL (ref 100–188)

## 2019-08-30 LAB — HEPATITIS B SURFACE ANTIBODY,QUALITATIVE: Hep B Surface Ab, Qual: REACTIVE

## 2019-08-30 LAB — CERULOPLASMIN: Ceruloplasmin: 36 mg/dL (ref 19.0–39.0)

## 2019-08-30 LAB — MITOCHONDRIAL ANTIBODIES: Mitochondrial Ab: <20 Units (ref 0.0–20.0)

## 2019-08-30 LAB — ANTI-SMOOTH MUSCLE ANTIBODY, IGG: Smooth Muscle Ab: 8 Units (ref 0–19)

## 2019-09-04 ENCOUNTER — Telehealth: Payer: Self-pay

## 2019-09-04 NOTE — Telephone Encounter (Signed)
-----   Message from Lucilla Lame, MD sent at 09/03/2019  6:21 PM EDT ----- Let the patient know that her fibrosis scan showed no fibrosis and her liver enzymes have returned to normal.  Her viral load is negative and this means she is likely part of the 10% of people who fight off the virus fighting cells without medication.  She should consider having a hepatitis A vaccination since she is not immune to hepatitis A but she is immune to hepatitis B.  She should have her viral load checked again in 6 months to make sure she does not have hepatitis C levels too low for detection at this time and may increase in the future but this is unlikely.

## 2019-09-04 NOTE — Telephone Encounter (Signed)
Pt notified of results

## 2020-03-17 ENCOUNTER — Telehealth: Payer: Self-pay

## 2020-03-17 NOTE — Telephone Encounter (Signed)
Left vm informing pt she is due to repeat her HCV viral load and LFT's. Informed her she can come to our office or go to her PCP.

## 2020-03-17 NOTE — Telephone Encounter (Signed)
-----   Message from Rayann Heman, CMA sent at 09/04/2019  4:40 PM EDT ----- Recheck viral load and LFT's.

## 2021-06-24 IMAGING — CT CT RENAL STONE PROTOCOL
2 of 4 series · 15 of 46 positions shown, 17 images · non-contrast
Comparison: November 22, 2008

CLINICAL DATA: Left-sided flank pain.

EXAM:
CT ABDOMEN AND PELVIS WITHOUT CONTRAST
TECHNIQUE: Multidetector CT imaging of the abdomen and pelvis was performed
following the standard protocol without IV contrast.

[Series 2: axial st · axial · 0.67mm/px · z∈[-656,-231]mm · 12 of 96 slices shown, 14 images]
[im 6/96  soft-tissue]
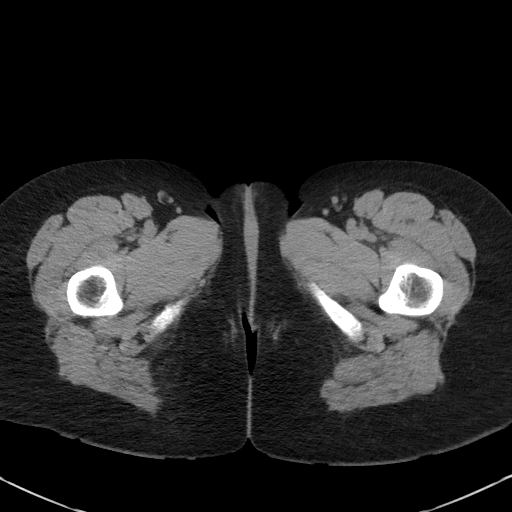
[im 6/96  bone]
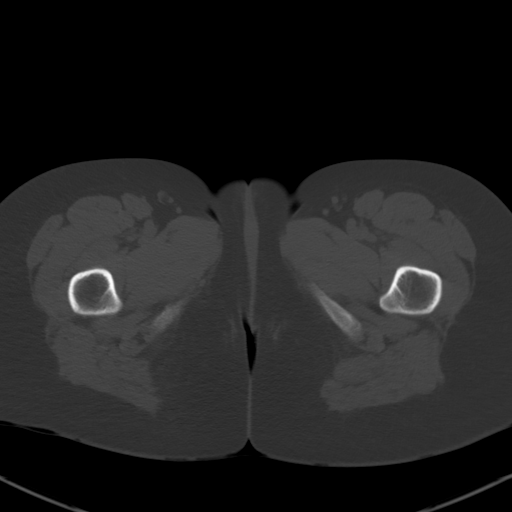
[im 16/96  soft-tissue]
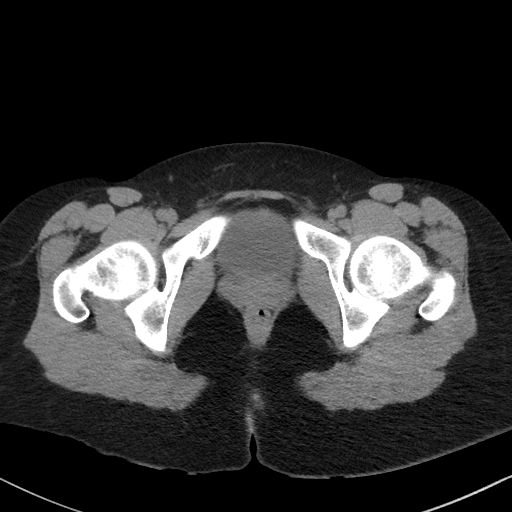
[im 21/96  soft-tissue]
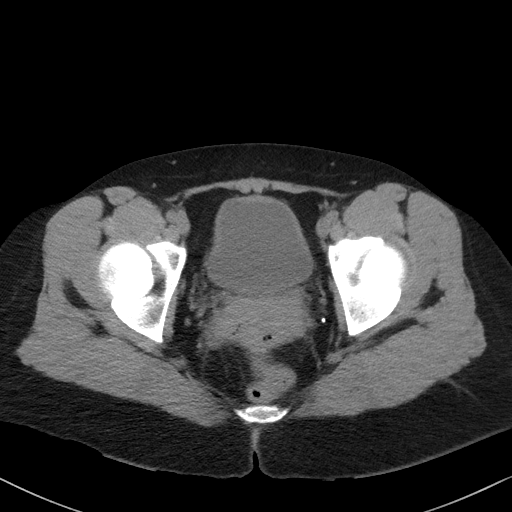
[im 31/96  soft-tissue]
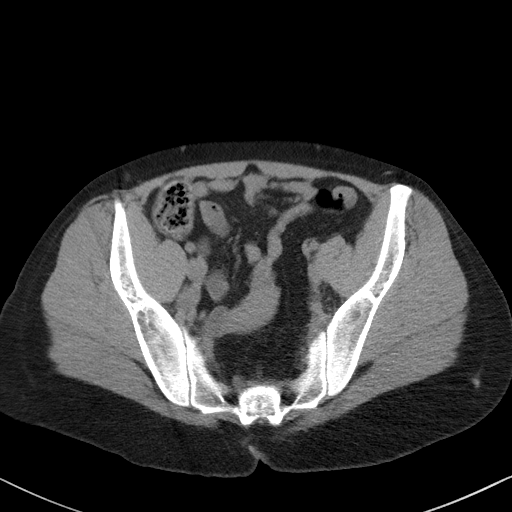
[im 36/96  soft-tissue]
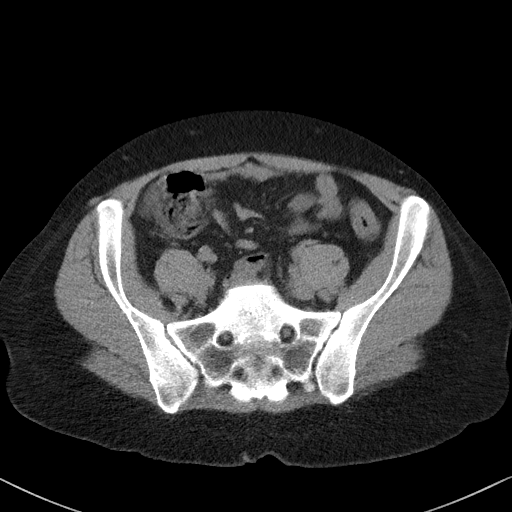
[im 46/96  soft-tissue]
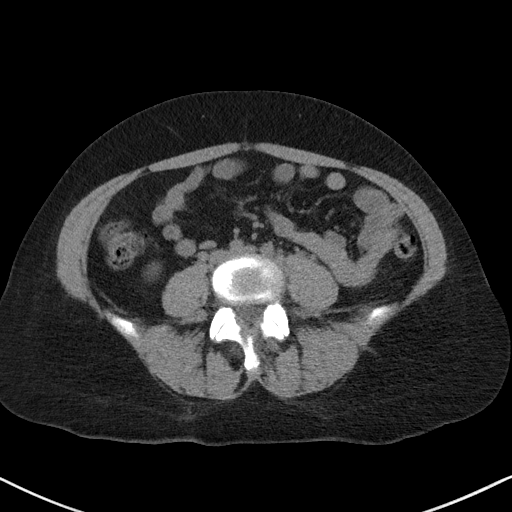
[im 51/96  soft-tissue]
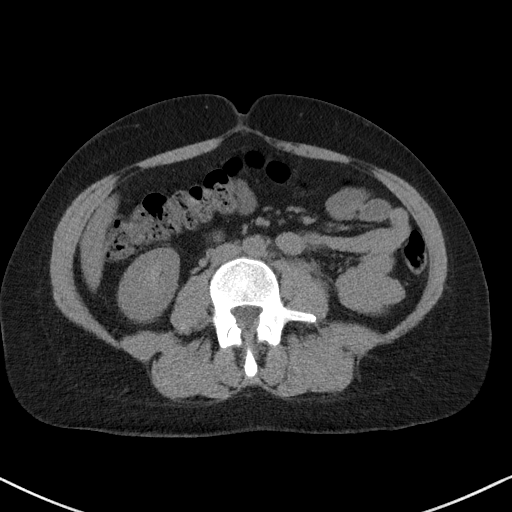
[im 61/96  soft-tissue]
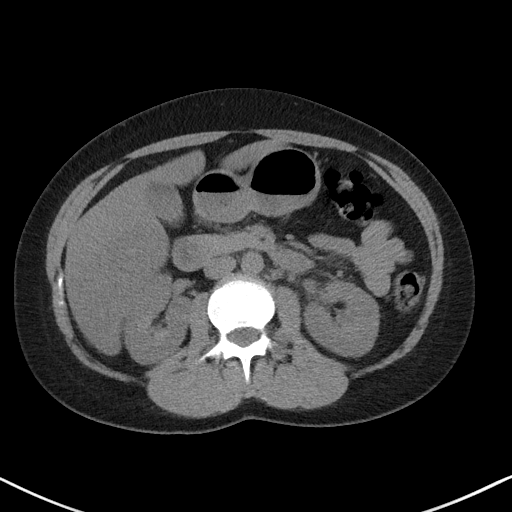
[im 66/96  soft-tissue]
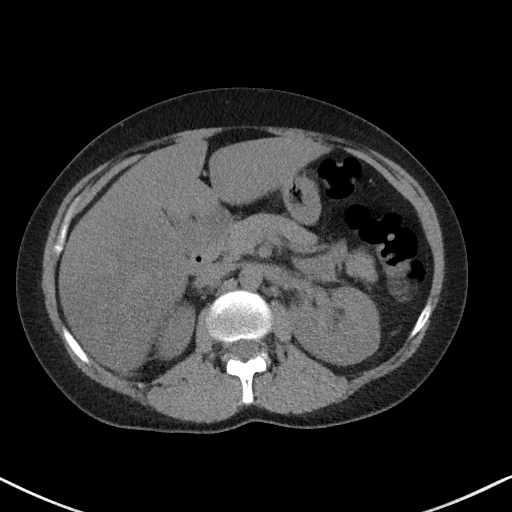
[im 66/96  bone]
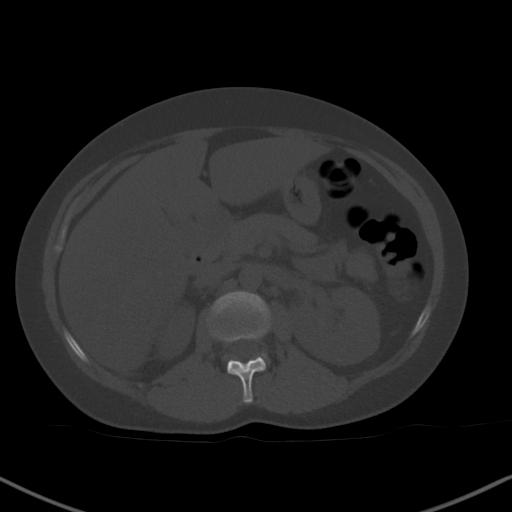
[im 76/96  soft-tissue]
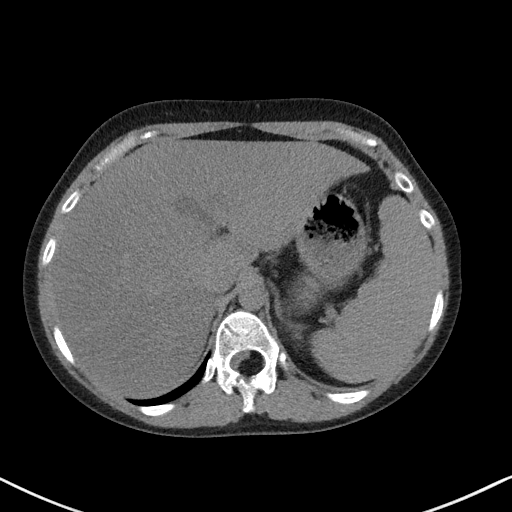
[im 81/96  soft-tissue]
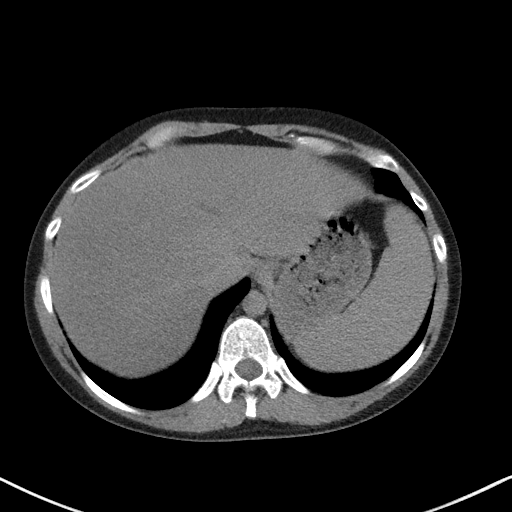
[im 91/96  soft-tissue]
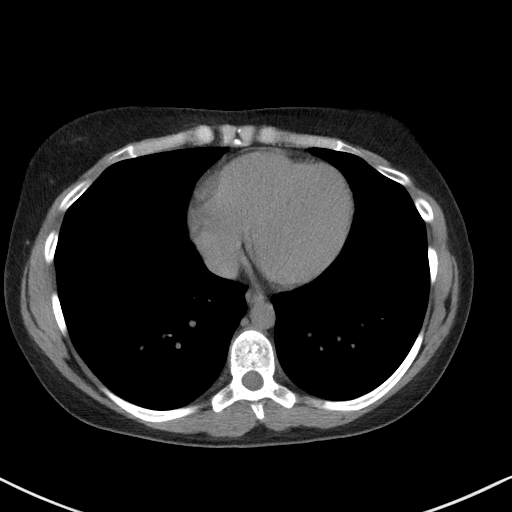

[Series 5: coronal st · coronal · 0.61mm/px · 3 of 78 slices shown]
[im 26/78  soft-tissue]
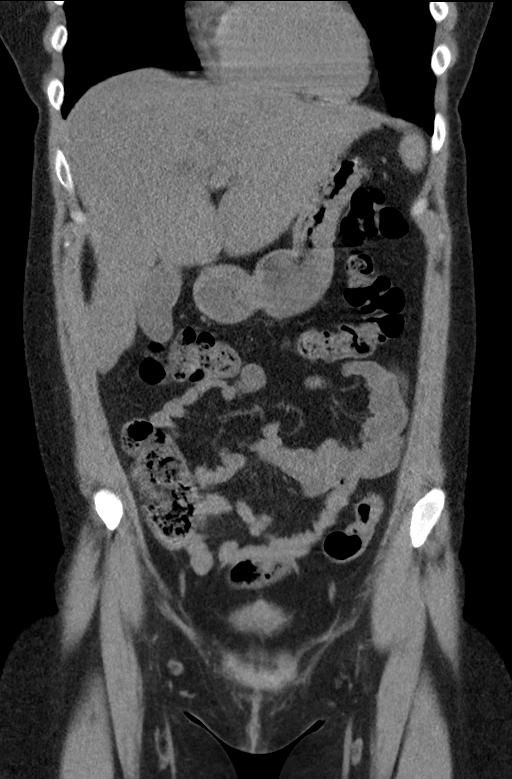
[im 35/78  soft-tissue]
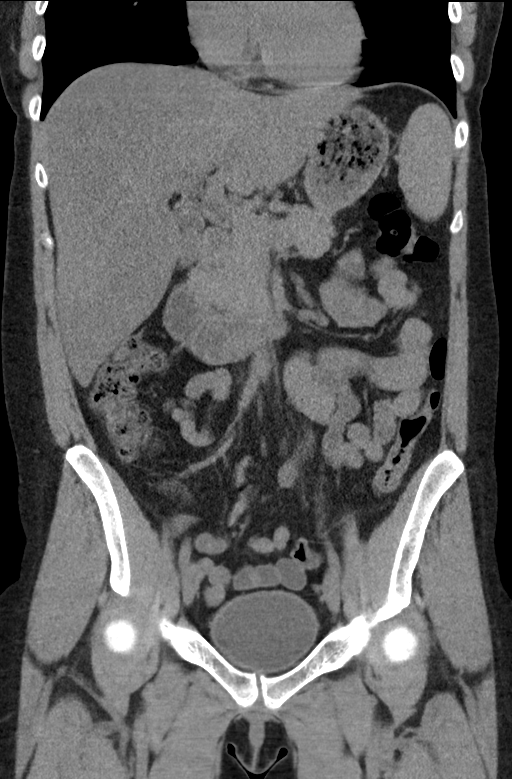
[im 43/78  soft-tissue]
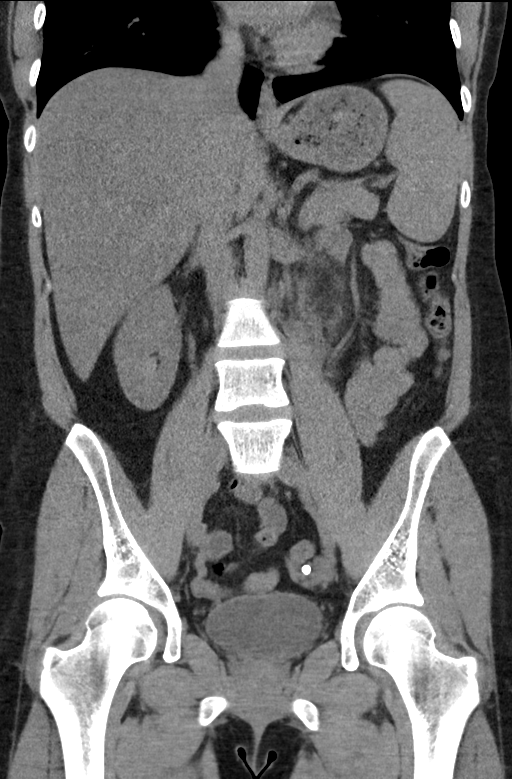

[15 of 46 positions shown; findings below may reference images not displayed]

FINDINGS: Lower chest: No acute abnormality.

Hepatobiliary: No focal liver abnormality is seen. No gallstones,
gallbladder wall thickening, or biliary dilatation.

Pancreas: Unremarkable. No pancreatic ductal dilatation or
surrounding inflammatory changes.

Spleen: Normal in size without focal abnormality.

Adrenals/Urinary Tract: Normal adrenal glands. Normal right kidney,
ureter and urinary bladder. Mild left perirenal and periureteral fat
stranding. The left ureter is slightly dilated proximally. No
obstructing stone is seen.

Stomach/Bowel: Stomach is within normal limits. No evidence of
appendicitis. No evidence of bowel wall thickening, distention, or
inflammatory changes.

Vascular/Lymphatic: No significant vascular findings are present. No
enlarged abdominal or pelvic lymph nodes.

Reproductive: Uterus and bilateral adnexa are unremarkable. Large
phleboliths within the left adnexa.

Other: No abdominal wall hernia or abnormality. No abdominopelvic
ascites.

Musculoskeletal: No acute or significant osseous findings.
IMPRESSION: 1. Left perirenal and periureteral fat stranding with mild dilation
of the proximal left ureter. No obstructing stone is seen. These
findings may represent sequela of recently passed stone or
infectious/inflammatory changes.
2. No evidence of nephrolithiasis or obstructive uropathy.
